# Patient Record
Sex: Female | Born: 1990 | Race: Black or African American | Hispanic: No | Marital: Married | Smoking: Never smoker
Health system: Southern US, Community
[De-identification: ages and names within clinical notes are randomized; demographics above are authoritative.]

## PROBLEM LIST (undated history)

## (undated) DIAGNOSIS — A630 Anogenital (venereal) warts: Secondary | ICD-10-CM

## (undated) DIAGNOSIS — D649 Anemia, unspecified: Secondary | ICD-10-CM

## (undated) DIAGNOSIS — D573 Sickle-cell trait: Secondary | ICD-10-CM

## (undated) HISTORY — DX: Anogenital (venereal) warts: A63.0

## (undated) HISTORY — DX: Anemia, unspecified: D64.9

## (undated) HISTORY — PX: NO PAST SURGERIES: SHX2092

## (undated) HISTORY — PX: WISDOM TOOTH EXTRACTION: SHX21

## (undated) HISTORY — DX: Sickle-cell trait: D57.3

---

## 2015-04-04 ENCOUNTER — Encounter (HOSPITAL_COMMUNITY): Payer: Self-pay | Admitting: *Deleted

## 2015-04-04 ENCOUNTER — Emergency Department (INDEPENDENT_AMBULATORY_CARE_PROVIDER_SITE_OTHER)
Admission: EM | Admit: 2015-04-04 | Discharge: 2015-04-04 | Disposition: A | Discharge status: Home / Self Care | Payer: Self-pay | Source: Home / Self Care | Attending: Emergency Medicine | Admitting: Emergency Medicine

## 2015-04-04 DIAGNOSIS — B373 Candidiasis of vulva and vagina: Secondary | ICD-10-CM

## 2015-04-04 DIAGNOSIS — B3731 Acute candidiasis of vulva and vagina: Secondary | ICD-10-CM

## 2015-04-04 LAB — POCT URINALYSIS DIP (DEVICE)
Bilirubin Urine: NEGATIVE
Glucose, UA: NEGATIVE mg/dL
Hgb urine dipstick: NEGATIVE
KETONES UR: NEGATIVE mg/dL
Leukocytes, UA: NEGATIVE
Nitrite: NEGATIVE
PH: 5.5 (ref 5.0–8.0)
PROTEIN: NEGATIVE mg/dL
Specific Gravity, Urine: >=1.03 (ref 1.005–1.030)
UROBILINOGEN UA: 0.2 mg/dL (ref 0.0–1.0)

## 2015-04-04 LAB — POCT PREGNANCY, URINE: PREG TEST UR: NEGATIVE

## 2015-04-04 MED ORDER — FLUCONAZOLE 150 MG PO TABS: 150.0000 mg | ORAL_TABLET | Freq: Once | ORAL | Status: DC

## 2015-04-04 NOTE — Discharge Instructions (Signed)
You have a yeast infection. We sent swabs off for testing. We will call you if anything else is positive. Take Diflucan one pill once. You should see improvement within 1-3 days. This is not contagious. Follow-up as needed.

## 2015-04-04 NOTE — ED Notes (Signed)
Pt   Reports       White   Discharge       With    Odor          And   Small pimples          pt      Reports   Symptoms    X  1  Week  Worse  Over  Last   Several  Days

## 2015-04-04 NOTE — ED Provider Notes (Signed)
CSN: [id number]     Arrival date & time 04/04/15  1631 History   First MD Initiated Contact with Patient 04/04/15 1738     Chief Complaint  Patient presents with  . Vaginal Discharge   (Consider location/radiation/quality/duration/timing/severity/associated sxs/prior Treatment) HPI She is a 24 year old woman here for evaluation of vaginal discharge. She reports a white discharge over the last several days. She has also noted some bumps around her vagina. She denies any itching. She does report some mild abdominal pain. This has gradually been getting worse. No fever or chills. She had unprotected sex earlier this month. This was with a new partner.  History reviewed. No pertinent past medical history. History reviewed. No pertinent past surgical history. No family history on file. Social History  Substance Use Topics  . Smoking status: Never Smoker   . Smokeless tobacco: None  . Alcohol Use: No   OB History    No data available     Review of Systems As in history of present illness Allergies  Review of patient's allergies indicates no known allergies.  Home Medications   Prior to Admission medications   Medication Sig Start Date End Date Taking? Authorizing Provider  fluconazole (DIFLUCAN) 150 MG tablet Take 1 tablet (150 mg total) by mouth once. 04/04/15   Charm Rings, MD   BP 114/80 mmHg  Pulse 78  Temp(Src) 98.1 F (36.7 C) (Oral)  Resp 16  SpO2 98%  LMP 03/12/2015 (Exact Date) Physical Exam  Constitutional: She is oriented to person, place, and time. She appears well-developed and well-nourished. No distress.  Cardiovascular: Normal rate.   Pulmonary/Chest: Effort normal.  Genitourinary:    Cervix exhibits no motion tenderness and no discharge. No bleeding in the vagina. No foreign body around the vagina. Vaginal discharge (thick adherent white along with some clear thin discharge) found.  Neurological: She is alert and oriented to person, place, and time.     ED Course  Procedures (including critical care time) Labs Review Labs Reviewed  HERPES SIMPLEX VIRUS CULTURE  POCT URINALYSIS DIP (DEVICE)  POCT PREGNANCY, URINE  CERVICOVAGINAL ANCILLARY ONLY    Imaging Review No results found.   MDM   1. Yeast vaginitis    Exam is consistent with yeast vaginitis. Swabs sent for additional evaluation. Treat with Diflucan. Follow-up as needed.    Charm Rings, MD 04/04/15 [phone]

## 2015-04-05 LAB — CERVICOVAGINAL ANCILLARY ONLY
Chlamydia: NEGATIVE
Neisseria Gonorrhea: NEGATIVE

## 2015-04-06 LAB — CERVICOVAGINAL ANCILLARY ONLY: WET PREP (BD AFFIRM): POSITIVE — AB

## 2015-04-06 LAB — HERPES SIMPLEX VIRUS CULTURE: Culture: NOT DETECTED

## 2015-04-10 MED ORDER — METRONIDAZOLE 500 MG PO TABS: 500.0000 mg | ORAL_TABLET | Freq: Two times a day (BID) | ORAL | Status: DC

## 2015-04-10 NOTE — ED Notes (Signed)
Patient called about final report of testing. Positive for gardnerella, yeast. Already treated for yeast, new Rx for BV authorized by Randal Buba, MD. Spoke to patient to inform of results, requests %Rx called in to [facility], at [place], [address]. Called nad spoke directly w pharmacist. Rx Flagyl 500 mg, BID x 7 days, QS, NR

## 2015-07-24 LAB — OB RESULTS CONSOLE RUBELLA ANTIBODY, IGM: RUBELLA: IMMUNE

## 2015-07-24 LAB — OB RESULTS CONSOLE ABO/RH: RH Type: POSITIVE

## 2015-07-24 LAB — OB RESULTS CONSOLE GC/CHLAMYDIA
CHLAMYDIA, DNA PROBE: NEGATIVE
GC PROBE AMP, GENITAL: NEGATIVE

## 2015-07-24 LAB — OB RESULTS CONSOLE ANTIBODY SCREEN: Antibody Screen: NEGATIVE

## 2015-07-24 LAB — OB RESULTS CONSOLE RPR: RPR: NONREACTIVE

## 2015-07-24 LAB — OB RESULTS CONSOLE HIV ANTIBODY (ROUTINE TESTING): HIV: NONREACTIVE

## 2015-07-24 LAB — OB RESULTS CONSOLE HEPATITIS B SURFACE ANTIGEN: Hepatitis B Surface Ag: NEGATIVE

## 2015-08-20 NOTE — L&D Delivery Note (Signed)
Patient is 25 y.o. G1P0 7635w1d admitted for PD-IOL. Progressed normally through labor.    Delivery Note At 8:02 PM a viable female was delivered via Vaginal, Spontaneous Delivery (Presentation: ; Occiput Anterior).  APGAR: 8, 9; weight 6 lb 15.5 oz (3161 g).   Placenta status: Intact, Spontaneous.  Cord: 3 vessels with the following complications: None.  Cord pH: not collected  Anesthesia:   Episiotomy:   Lacerations: 2nd degree;Labial Suture Repair: 3.0 vicryl- Repaired 2nd degree in the typical fashion. Right labial which was close to clitoral hood was repaired with two figure eight sutures. Left labia repaired with two figure of eight sutures.  Est. Blood Loss (mL):  150  Mom to postpartum.  Baby to Couplet care / Skin to Skin.  Isa RankinKimberly Niles [facility] 01/20/2016, 9:37 PM

## 2015-12-21 LAB — OB RESULTS CONSOLE GBS: GBS: NEGATIVE

## 2016-01-16 ENCOUNTER — Telehealth (HOSPITAL_COMMUNITY): Payer: Self-pay | Admitting: *Deleted

## 2016-01-16 ENCOUNTER — Encounter (HOSPITAL_COMMUNITY): Payer: Self-pay | Admitting: *Deleted

## 2016-01-16 NOTE — Telephone Encounter (Signed)
Preadmission screen Interpreter number [id number] 

## 2016-01-17 ENCOUNTER — Ambulatory Visit (INDEPENDENT_AMBULATORY_CARE_PROVIDER_SITE_OTHER): Payer: Medicaid Other | Admitting: *Deleted

## 2016-01-17 VITALS — BP 113/83 | HR 108

## 2016-01-17 DIAGNOSIS — Z36 Encounter for antenatal screening of mother: Secondary | ICD-10-CM | POA: Diagnosis not present

## 2016-01-17 DIAGNOSIS — O480 Post-term pregnancy: Secondary | ICD-10-CM | POA: Diagnosis present

## 2016-01-17 NOTE — Progress Notes (Signed)
IOL scheduled 6/2 @ midnight

## 2016-01-17 NOTE — Progress Notes (Signed)
NST 01/17/16 reactive

## 2016-01-19 ENCOUNTER — Encounter (HOSPITAL_COMMUNITY): Payer: Self-pay

## 2016-01-19 ENCOUNTER — Inpatient Hospital Stay (HOSPITAL_COMMUNITY)
Admission: RE | Admit: 2016-01-19 | Discharge: 2016-01-22 | DRG: 774 | Disposition: A | Discharge status: Home / Self Care | Payer: Medicaid Other | Source: Ambulatory Visit | Attending: Family Medicine | Admitting: Family Medicine

## 2016-01-19 DIAGNOSIS — O480 Post-term pregnancy: Secondary | ICD-10-CM | POA: Diagnosis present

## 2016-01-19 DIAGNOSIS — O9832 Other infections with a predominantly sexual mode of transmission complicating childbirth: Secondary | ICD-10-CM | POA: Diagnosis present

## 2016-01-19 DIAGNOSIS — Z8249 Family history of ischemic heart disease and other diseases of the circulatory system: Secondary | ICD-10-CM

## 2016-01-19 DIAGNOSIS — Z3A41 41 weeks gestation of pregnancy: Secondary | ICD-10-CM | POA: Diagnosis not present

## 2016-01-19 DIAGNOSIS — S01512A Laceration without foreign body of oral cavity, initial encounter: Secondary | ICD-10-CM

## 2016-01-19 DIAGNOSIS — A630 Anogenital (venereal) warts: Secondary | ICD-10-CM | POA: Diagnosis present

## 2016-01-19 LAB — TYPE AND SCREEN
ABO/RH(D): B POS
ANTIBODY SCREEN: NEGATIVE

## 2016-01-19 LAB — CBC
HEMATOCRIT: 33.2 % — AB (ref 36.0–46.0)
HEMOGLOBIN: 11.5 g/dL — AB (ref 12.0–15.0)
MCH: 26.9 pg (ref 26.0–34.0)
MCHC: 34.6 g/dL (ref 30.0–36.0)
MCV: 77.6 fL — ABNORMAL LOW (ref 78.0–100.0)
Platelets: 196 10*3/uL (ref 150–400)
RBC: 4.28 MIL/uL (ref 3.87–5.11)
RDW: 14.2 % (ref 11.5–15.5)
WBC: 7.4 10*3/uL (ref 4.0–10.5)

## 2016-01-19 LAB — RPR: RPR: NONREACTIVE

## 2016-01-19 LAB — ABO/RH: ABO/RH(D): B POS

## 2016-01-19 MED ORDER — OXYCODONE-ACETAMINOPHEN 5-325 MG PO TABS
1.0000 | ORAL_TABLET | ORAL | Status: DC | PRN
Start: 2016-01-19 — End: 2016-01-20

## 2016-01-19 MED ORDER — OXYTOCIN BOLUS FROM INFUSION
500.0000 mL | INTRAVENOUS | Status: DC
  Administered 2016-01-20: 500 mL via INTRAVENOUS

## 2016-01-19 MED ORDER — OXYTOCIN 40 UNITS IN LACTATED RINGERS INFUSION - SIMPLE MED: 2.5000 [IU]/h | INTRAVENOUS | Status: DC

## 2016-01-19 MED ORDER — LACTATED RINGERS IV SOLN: 500.0000 mL | INTRAVENOUS | Status: DC | PRN

## 2016-01-19 MED ORDER — FENTANYL CITRATE (PF) 100 MCG/2ML IJ SOLN
100.0000 ug | INTRAMUSCULAR | Status: DC | PRN
  Administered 2016-01-19 – 2016-01-20 (×2): 100 ug via INTRAVENOUS
  Filled 2016-01-19 (×2): qty 2

## 2016-01-19 MED ORDER — TERBUTALINE SULFATE 1 MG/ML IJ SOLN
0.2500 mg | Freq: Once | INTRAMUSCULAR | Status: DC | PRN
  Filled 2016-01-19: qty 1

## 2016-01-19 MED ORDER — LIDOCAINE HCL (PF) 1 % IJ SOLN
30.0000 mL | INTRAMUSCULAR | Status: DC | PRN
  Administered 2016-01-20: 30 mL via SUBCUTANEOUS
  Filled 2016-01-19: qty 30

## 2016-01-19 MED ORDER — ONDANSETRON HCL 4 MG/2ML IJ SOLN: 4.0000 mg | Freq: Four times a day (QID) | INTRAMUSCULAR | Status: DC | PRN

## 2016-01-19 MED ORDER — FLEET ENEMA 7-19 GM/118ML RE ENEM: 1.0000 | ENEMA | RECTAL | Status: DC | PRN

## 2016-01-19 MED ORDER — MISOPROSTOL 25 MCG QUARTER TABLET
25.0000 ug | ORAL_TABLET | ORAL | Status: DC | PRN
  Administered 2016-01-19 (×3): 25 ug via VAGINAL
  Filled 2016-01-19 (×2): qty 0.25
  Filled 2016-01-19: qty 1
  Filled 2016-01-19: qty 0.25

## 2016-01-19 MED ORDER — LACTATED RINGERS IV SOLN
INTRAVENOUS | Status: DC
  Administered 2016-01-19 (×4): via INTRAVENOUS

## 2016-01-19 MED ORDER — ACETAMINOPHEN 325 MG PO TABS: 650.0000 mg | ORAL_TABLET | ORAL | Status: DC | PRN

## 2016-01-19 MED ORDER — SOD CITRATE-CITRIC ACID 500-334 MG/5ML PO SOLN: 30.0000 mL | ORAL | Status: DC | PRN

## 2016-01-19 MED ORDER — OXYCODONE-ACETAMINOPHEN 5-325 MG PO TABS: 2.0000 | ORAL_TABLET | ORAL | Status: DC | PRN

## 2016-01-19 MED ORDER — ZOLPIDEM TARTRATE 5 MG PO TABS
5.0000 mg | ORAL_TABLET | Freq: Every evening | ORAL | Status: DC | PRN
  Administered 2016-01-19: 5 mg via ORAL
  Filled 2016-01-19 (×2): qty 1

## 2016-01-19 NOTE — Anesthesia Pain Management Evaluation Note (Signed)
  [facility] Pain Management Visit Note  Patient: [name], 25 y.o., female  "Hello I am a [facility] of the anesthesia [facility]. We have an anesthesia team available at all times to provide care throughout the hospital, including epidural management and anesthesia for C-section. I don't know your plan for the delivery whether it a natural birth, water birth, IV sedation, nitrous supplementation, doula or epidural, but we want to meet your pain goals."   1.Was your pain managed to your expectations on prior hospitalizations?   No prior hospitalizations  2.What is your expectation for pain management during this hospitalization?     Epidural  3.How can we help you reach that goal? Epidural when desired.  Record the patient's initial score and the patient's pain goal.   Pain: 4  Pain Goal: Undecided [facility] wants you to be able to say your pain was always managed very well.  Katie Mayer 01/19/2016

## 2016-01-19 NOTE — Progress Notes (Signed)
Patient ID: Katie Mayer, female   DOB: 01/14/1991, 25 y.o.   MRN: [id number]  Pt without complaint; s/p cytotec x 3 doses FHR 150s, +accels, no decels Irreg mild ctx Cx 1/70/-2; FB placed without difficulty  IUP@41 .0wks Cx unfavorable  Will leave foley  In place until it comes out Anticipate SVD  Cam HaiSHAW, [name] CNM 01/19/2016 6:45 PM

## 2016-01-19 NOTE — H&P (Signed)
  Katie Mayer is a 25 y.o. female G1P0 with IUP at 4758w0d presenting for IOL for postdates. [facility]   Prenatal History/Complications:  none   Past Medical History: Past Medical History  Diagnosis Date  . HPV (human papilloma virus) anogenital infection   . Anemia     Past Surgical History: History reviewed. No pertinent past surgical history.  Obstetrical History: OB History    Gravida Para Term Preterm AB TAB SAB Ectopic Multiple Living   1               Social History: Social History   Social History  . Marital Status: Married    Spouse Name: N/A  . Number of Children: N/A  . Years of Education: N/A   Social History Main Topics  . Smoking status: Never Smoker   . Smokeless tobacco: None  . Alcohol Use: No  . Drug Use: None  . Sexual Activity: Not Asked   Other Topics Concern  . None   Social History Narrative    Family History: Family History  Problem Relation Age of Onset  . Hypertension Mother   . Hypertension Father     Allergies: No Known Allergies  Prescriptions prior to admission  Medication Sig Dispense Refill Last Dose  . fluconazole (DIFLUCAN) 150 MG tablet Take 1 tablet (150 mg total) by mouth once. 1 tablet 0   . metroNIDAZOLE (FLAGYL) 500 MG tablet Take 1 tablet (500 mg total) by mouth 2 (two) times daily. 14 tablet 0      Prenatal Transfer Tool  Maternal Diabetes: No Genetic Screening: Normal Maternal Ultrasounds/Referrals: Normal Fetal Ultrasounds or other Referrals:  None Maternal Substance Abuse:  No Significant Maternal Medications:  None Significant Maternal Lab Results: None     Review of Systems   Constitutional: Negative for fever and chills Eyes: Negative for visual disturbances Respiratory: Negative for shortness of breath, dyspnea Cardiovascular: Negative for chest pain or palpitations  Gastrointestinal: Negative for abdominal pain, vomiting, diarrhea and constipation.   Genitourinary: Negative  for dysuria and urgency Musculoskeletal: Negative for back pain, joint pain, myalgias  Neurological: Negative for dizziness and headaches      Height 5\' 8"  (1.727 m), weight 94.802 kg (209 lb), last menstrual period 04/07/2015. General appearance: alert, cooperative and no distress Lungs: clear to auscultation bilaterally Heart: regular rate and rhythm Abdomen: soft, non-tender; bowel sounds normal Extremities: Homans sign is negative, no sign of DVT DTR's 2+ Presentation: cephalic Fetal monitoring  Baseline: 140 bpm, Variability: Good {> 6 bpm), Accelerations: Reactive and Decelerations: Absent Uterine activity  None  Dilation: Closed Effacement (%): 50 Station: -2 Exam by:: Drenda FreezeFran CNM   Prenatal labs: ABO, Rh: B/Positive/-- (12/05 0000) Antibody: Negative (12/05 0000) Rubella: immune RPR: Nonreactive (12/05 0000)  HBsAg: Negative (12/05 0000)  HIV: Non-reactive (12/05 0000)  GBS: Negative (05/04 0000)  1 hr Glucola 105 Genetic screening  Neg AFP Anatomy US normal   No results found for this or any previous visit (from the past 24 hour(s)).  Assessment: Katie Mayer is a 25 y.o. G1P0 with an IUP at 5258w0d presenting for IOL for postdates.  Plan: #Labor: Cytotec->Foley->pitocin #Pain:  Per request #FWB Cat 1 #ID: GBS: neg   CRESENZO-DISHMAN,Antron Seth 01/19/2016, 1:38 AM

## 2016-01-20 ENCOUNTER — Inpatient Hospital Stay (HOSPITAL_COMMUNITY): Payer: Medicaid Other | Admitting: Anesthesiology

## 2016-01-20 DIAGNOSIS — O480 Post-term pregnancy: Secondary | ICD-10-CM

## 2016-01-20 DIAGNOSIS — Z3A41 41 weeks gestation of pregnancy: Secondary | ICD-10-CM

## 2016-01-20 DIAGNOSIS — S01512A Laceration without foreign body of oral cavity, initial encounter: Secondary | ICD-10-CM

## 2016-01-20 MED ORDER — DIPHENHYDRAMINE HCL 50 MG/ML IJ SOLN: 12.5000 mg | INTRAMUSCULAR | Status: DC | PRN

## 2016-01-20 MED ORDER — OXYTOCIN 40 UNITS IN LACTATED RINGERS INFUSION - SIMPLE MED
1.0000 m[IU]/min | INTRAVENOUS | Status: DC
  Administered 2016-01-20: 2 m[IU]/min via INTRAVENOUS
  Filled 2016-01-20: qty 1000

## 2016-01-20 MED ORDER — FENTANYL 2.5 MCG/ML BUPIVACAINE 1/10 % EPIDURAL INFUSION (WH - ANES)
14.0000 mL/h | INTRAMUSCULAR | Status: DC | PRN
  Administered 2016-01-20: 14 mL/h via EPIDURAL
  Filled 2016-01-20: qty 125

## 2016-01-20 MED ORDER — OXYCODONE-ACETAMINOPHEN 5-325 MG PO TABS: 2.0000 | ORAL_TABLET | ORAL | Status: DC | PRN

## 2016-01-20 MED ORDER — OXYCODONE-ACETAMINOPHEN 5-325 MG PO TABS
1.0000 | ORAL_TABLET | ORAL | Status: DC | PRN
  Administered 2016-01-21 (×2): 1 via ORAL
  Filled 2016-01-20 (×2): qty 1

## 2016-01-20 MED ORDER — TERBUTALINE SULFATE 1 MG/ML IJ SOLN
0.2500 mg | Freq: Once | INTRAMUSCULAR | Status: DC | PRN
  Filled 2016-01-20: qty 1

## 2016-01-20 MED ORDER — BENZOCAINE-MENTHOL 20-0.5 % EX AERO
1.0000 "application " | INHALATION_SPRAY | CUTANEOUS | Status: DC | PRN
  Administered 2016-01-21 – 2016-01-22 (×2): 1 via TOPICAL
  Filled 2016-01-20 (×2): qty 56

## 2016-01-20 MED ORDER — ONDANSETRON HCL 4 MG/2ML IJ SOLN: 4.0000 mg | INTRAMUSCULAR | Status: DC | PRN

## 2016-01-20 MED ORDER — DIBUCAINE 1 % RE OINT
1.0000 "application " | TOPICAL_OINTMENT | RECTAL | Status: DC | PRN
  Administered 2016-01-21: 1 via RECTAL
  Filled 2016-01-20: qty 28

## 2016-01-20 MED ORDER — EPHEDRINE 5 MG/ML INJ
10.0000 mg | INTRAVENOUS | Status: DC | PRN
  Filled 2016-01-20: qty 2

## 2016-01-20 MED ORDER — DIPHENHYDRAMINE HCL 25 MG PO CAPS: 25.0000 mg | ORAL_CAPSULE | Freq: Four times a day (QID) | ORAL | Status: DC | PRN

## 2016-01-20 MED ORDER — LIDOCAINE HCL (PF) 1 % IJ SOLN
INTRAMUSCULAR | Status: DC | PRN
  Administered 2016-01-20 (×2): 5 mL

## 2016-01-20 MED ORDER — ONDANSETRON HCL 4 MG PO TABS: 4.0000 mg | ORAL_TABLET | ORAL | Status: DC | PRN

## 2016-01-20 MED ORDER — SIMETHICONE 80 MG PO CHEW: 80.0000 mg | CHEWABLE_TABLET | ORAL | Status: DC | PRN

## 2016-01-20 MED ORDER — LACTATED RINGERS IV SOLN
500.0000 mL | Freq: Once | INTRAVENOUS | Status: AC
  Administered 2016-01-20: 500 mL via INTRAVENOUS

## 2016-01-20 MED ORDER — PHENYLEPHRINE 40 MCG/ML (10ML) SYRINGE FOR IV PUSH (FOR BLOOD PRESSURE SUPPORT)
80.0000 ug | PREFILLED_SYRINGE | INTRAVENOUS | Status: DC | PRN
  Filled 2016-01-20: qty 10
  Filled 2016-01-20: qty 5

## 2016-01-20 MED ORDER — PHENYLEPHRINE 40 MCG/ML (10ML) SYRINGE FOR IV PUSH (FOR BLOOD PRESSURE SUPPORT)
80.0000 ug | PREFILLED_SYRINGE | INTRAVENOUS | Status: DC | PRN
  Filled 2016-01-20: qty 5

## 2016-01-20 MED ORDER — COCONUT OIL OIL: 1.0000 "application " | TOPICAL_OIL | Status: DC | PRN

## 2016-01-20 MED ORDER — DOCUSATE SODIUM 100 MG PO CAPS
100.0000 mg | ORAL_CAPSULE | Freq: Two times a day (BID) | ORAL | Status: DC
  Administered 2016-01-21 – 2016-01-22 (×4): 100 mg via ORAL
  Filled 2016-01-20 (×4): qty 1

## 2016-01-20 MED ORDER — ACETAMINOPHEN 325 MG PO TABS: 650.0000 mg | ORAL_TABLET | ORAL | Status: DC | PRN

## 2016-01-20 MED ORDER — PRENATAL MULTIVITAMIN CH
1.0000 | ORAL_TABLET | Freq: Every day | ORAL | Status: DC
  Administered 2016-01-21 – 2016-01-22 (×2): 1 via ORAL
  Filled 2016-01-20 (×2): qty 1

## 2016-01-20 MED ORDER — IBUPROFEN 600 MG PO TABS
600.0000 mg | ORAL_TABLET | Freq: Four times a day (QID) | ORAL | Status: DC
  Administered 2016-01-21 – 2016-01-22 (×7): 600 mg via ORAL
  Filled 2016-01-20 (×7): qty 1

## 2016-01-20 MED ORDER — WITCH HAZEL-GLYCERIN EX PADS
1.0000 "application " | MEDICATED_PAD | CUTANEOUS | Status: DC | PRN
  Administered 2016-01-21: 1 via TOPICAL

## 2016-01-20 NOTE — Progress Notes (Signed)
Labor Progress Note Katie DoomYasmina Mayer is a 25 y.o. G1P0 at 6162w1d presented for PD- IOL  S: s/p FB  O:  BP 116/74 mmHg  Pulse 80  Temp(Src) 98.3 F (36.8 C) (Oral)  Resp 18  Ht 5\' 8"  (1.727 m)  Wt 209 lb (94.802 kg)  BMI 31.79 kg/m2  LMP 04/07/2015 EFM: 145/mod/+accels, variable decels- occasional  CVE: Dilation: 3.5 Effacement (%): 70 Cervical Position: Posterior Station: -2 Presentation: Vertex Exam by:: Katrinka BlazingSmith RN   A&P: 25 y.o. G1P0 7162w1d PD-IOL #Labor: Progressing well. S/p Cytotec/FB. Starting pitocin.  #Pain: prn meds, epidural at maternal request #FWB:  Cat II, good beat to beat  #GBS negative  Federico FlakeKimberly Niles Theo Krumholz, MD 10:21 AM

## 2016-01-20 NOTE — Anesthesia Preprocedure Evaluation (Signed)

## 2016-01-20 NOTE — Anesthesia Procedure Notes (Signed)
Epidural Patient location during procedure: OB  Staffing Anesthesiologist: Zo Loudon Performed by: anesthesiologist   Preanesthetic Checklist Completed: patient identified, site marked, surgical consent, pre-op evaluation, timeout performed, IV checked, risks and benefits discussed and monitors and equipment checked  Epidural Patient position: sitting Prep: DuraPrep Patient monitoring: heart rate, continuous pulse ox and blood pressure Approach: right paramedian Location: L3-L4 Injection technique: LOR saline  Needle:  Needle type: Tuohy  Needle gauge: 17 G Needle length: 9 cm and 9 Needle insertion depth: 6 cm Catheter type: closed end flexible Catheter size: 20 Guage Catheter at skin depth: 11 cm Test dose: negative  Assessment Events: blood not aspirated, injection not painful, no injection resistance, negative IV test and no paresthesia  Additional Notes Patient identified. Risks/Benefits/Options discussed with patient including but not limited to bleeding, infection, nerve damage, paralysis, failed block, incomplete pain control, headache, blood pressure changes, nausea, vomiting, reactions to medication both or allergic, itching and postpartum back pain. Confirmed with bedside nurse the patient's most recent platelet count. Confirmed with patient that they are not currently taking any anticoagulation, have any bleeding history or any family history of bleeding disorders. Patient expressed understanding and wished to proceed. All questions were answered. Sterile technique was used throughout the entire procedure. Please see nursing notes for vital signs. Test dose was given through epidural needle and negative prior to continuing to dose epidural or start infusion. Warning signs of high block given to the patient including shortness of breath, tingling/numbness in hands, complete motor block, or any concerning symptoms with instructions to call for help. Patient was given  instructions on fall risk and not to get out of bed. All questions and concerns addressed with instructions to call with any issues.   

## 2016-01-20 NOTE — Progress Notes (Signed)
Patient ID: Katie Mayer, female   DOB: 17-Jun-1991, 25 y.o.   MRN: [id number]  Foley bulb out approx 2 hrs ago. Pt sleeping.  FHR 130s, +accels, no decels Ctx irreg Exam deferred  IUP@term  Post dates  Will allow to eat a light breakfast and then will start Pit Anticipate SVD  Clelia CroftSHAW, [facility]LY 01/20/2016 7:29 AM

## 2016-01-21 NOTE — Progress Notes (Signed)
Post Partum Day 1 Subjective:  Katie Mayer is a 25 y.o. G1P0 10960w2d s/p spontaneous vaginal delivery.  No acute events overnight.  Pt denies problems with ambulating or voiding.  She denies nausea or vomiting.  Pain is well controlled.  She has had flatus.  Lochia Minimal.  Plan for birth control is condoms.  Method of Feeding: breast.  Objective: Blood pressure 119/72, pulse 85, temperature 97.5 F (36.4 C), temperature source Oral, resp. rate 16, height 5\' 8"  (1.727 m), weight 94.802 kg (209 lb), last menstrual period 04/07/2015, SpO2 96 %.  Physical Exam:  General: alert, cooperative and no distress Chest: normal WOB Heart: Regular rate Abdomen: +BS, soft, mild TTP (appropriate) DVT Evaluation: no evidence of DVT seen on physical exam. Extremities: no edema   Recent Labs  01/19/16 0200  HGB 11.5*  HCT 33.2*    Assessment/Plan:  ASSESSMENT: Katie Mayer is a 25 y.o. G1P0 3060w2d s/p spontaneous vaginal delivery.  #Postpartum Care Plan for discharge tomorrow Continue routine PP care Breastfeeding support PRN  OB fellow attestation:  I have seen and examined this patient; I agree with above documentation in the resident's note.   Federico FlakeKimberly Niles Hakeem Frazzini, MD 9:40 AM

## 2016-01-21 NOTE — Anesthesia Postprocedure Evaluation (Signed)
Anesthesia Post Note  Patient: Juanito DoomYasmina Schendel  Procedure(s) Performed: * No procedures listed *  Patient location during evaluation: Mother Baby Anesthesia Type: Epidural Level of consciousness: awake and alert Pain management: pain level controlled Vital Signs Assessment: post-procedure vital signs reviewed and stable Respiratory status: spontaneous breathing, nonlabored ventilation and respiratory function stable Cardiovascular status: stable Postop Assessment: no headache, no backache and epidural receding Anesthetic complications: no     Last Vitals:  Filed Vitals:   01/20/16 2230 01/21/16 0000  BP: 115/67 119/72  Pulse: 99 85  Temp: 36.9 C 36.4 C  Resp: 18 16    Last Pain:  Filed Vitals:   01/21/16 0649  PainSc: 0-No pain   Pain Goal:                 Junious SilkGILBERT,Rosalena Mccorry

## 2016-01-21 NOTE — Lactation Note (Signed)
This note was copied from a baby's chart. Lactation Consultation Note  Assisted mother with latching baby in a laid back position.  He was difficult to latch and once he was attached he had to be stimulated to suckle. Long jaw excursions were not present and the suck swallow ratio was 6-12:1 which indicates difficulty with transfer. Mom was taught hand expression and she is easily able to express colostrum.  A small amount was spoon fed to the baby. He had difficulty extending his tongue past the gum line when his mouth was opened so the spoon was placed on his gum and he was able to reach the milk with his tongue. A frenum is visible under his tongue. He had difficulty sucking on a gloved finger. Concern that he may continue to not transfer well.  This information was communicated to Dr Manson PasseyBrown and Barnetta ChapelLauren Rafeek NP.  Will set up a double electric breast pump to aid in protecting supply and encourage mother to spoon fed any expressed milk back to the baby. Patient Name: Katie Juanito DoomYasmina Guard [id number]'s Date: 01/21/2016 Reason for consult: Initial assessment   Maternal Data Has patient been taught Hand Expression?: Yes  Feeding Feeding Type: Breast Fed  LATCH Score/Interventions Latch: Repeated attempts needed to sustain latch, nipple held in mouth throughout feeding, stimulation needed to elicit sucking reflex.  Audible Swallowing: A few with stimulation  Type of Nipple: Everted at rest and after stimulation  Comfort (Breast/Nipple): Soft / non-tender     Hold (Positioning): Assistance needed to correctly position infant at breast and maintain latch.  LATCH Score: 7  Lactation Tools Discussed/Used     Consult Status Consult Status: Follow-up Date: 01/22/16 Follow-up type: In-patient    Katie Mayer, Katie Mayer 01/21/2016, 10:36 AM

## 2016-01-22 MED ORDER — SIMETHICONE 80 MG PO CHEW: 80.0000 mg | CHEWABLE_TABLET | Freq: Once | ORAL | Status: DC

## 2016-01-22 MED ORDER — IBUPROFEN 600 MG PO TABS: 600.0000 mg | ORAL_TABLET | Freq: Four times a day (QID) | ORAL | Status: DC

## 2016-01-22 MED ORDER — DOCUSATE SODIUM 100 MG PO CAPS: 100.0000 mg | ORAL_CAPSULE | Freq: Two times a day (BID) | ORAL | Status: DC

## 2016-01-22 MED ORDER — OXYCODONE-ACETAMINOPHEN 5-325 MG PO TABS: 1.0000 | ORAL_TABLET | Freq: Three times a day (TID) | ORAL | Status: DC | PRN

## 2016-01-22 NOTE — Lactation Note (Addendum)
This note was copied from a baby's chart. Lactation Consultation Note  Assisted mother with deeper latch.  Suck swallow ratio continues to be 6-7:1.  Recommended mother post-pump for 10 minutes after BF, alternating breasts at each feeding as she will only have a hand pump at home. Pumping will help to support milk supply related to current high suck:swallow ratio.  Aware of support groups and outpatient services.  Patient Name: Katie Mayer DoomYasmina Cambre ZOXWR'UToday's Date: 01/22/2016 Reason for consult: Follow-up assessment   Maternal Data    Feeding Feeding Type: Breast Fed Length of feed: 10 min  LATCH Score/Interventions Latch: Repeated attempts needed to sustain latch, nipple held in mouth throughout feeding, stimulation needed to elicit sucking reflex. (end of feeding) Intervention(s): Adjust position;Assist with latch (encouraged mother to wait for infant to open mouth wide)  Audible Swallowing: A few with stimulation Intervention(s): Skin to skin;Hand expression  Type of Nipple: Everted at rest and after stimulation Intervention(s): Hand pump;Double electric pump  Comfort (Breast/Nipple): Soft / non-tender  Problem noted: Mild/Moderate discomfort Interventions (Mild/moderate discomfort): Hand expression;Post-pump (colostrum to nippple)  Hold (Positioning): Assistance needed to correctly position infant at breast and maintain latch.  LATCH Score: 7  Lactation Tools Discussed/Used     Consult Status      Soyla DryerJoseph, Marabelle Cushman 01/22/2016, 10:32 AM

## 2016-01-22 NOTE — Discharge Instructions (Signed)
Vaginal Delivery During delivery, your health care provider will help you give birth to your baby. During a vaginal delivery, you will work to push the baby out of your vagina. However, before you can push your baby out, a few things need to happen. The opening of your uterus (cervix) has to soften, thin out, and open up (dilate) all the way to 10 cm. Also, your baby has to move down from the uterus into your vagina.  SIGNS OF LABOR  Your health care provider will first need to make sure you are in labor. Signs of labor include:  1. Passing what is called the mucous plug before labor begins. This is a small amount of blood-stained mucus. 2. Having regular, painful uterine contractions.  3. The time between contractions gets shorter.  4. The discomfort and pain gradually get more intense. 5. Contraction pains get worse when walking and do not go away when resting.  6. Your cervix becomes thinner (effacement) and dilates. BEFORE THE DELIVERY Once you are in labor and admitted into the hospital or care center, your health care provider may do the following:   Perform a complete physical exam.  Review any complications related to pregnancy or labor.  Check your blood pressure, pulse, temperature, and heart rate (vital signs).   Determine if, and when, the rupture of amniotic membranes occurred.  Do a vaginal exam (using a sterile glove and lubricant) to determine:   The position (presentation) of the baby. Is the baby's head presenting first (vertex) in the birth canal (vagina), or are the feet or buttocks first (breech)?   The level (station) of the baby's head within the birth canal.   The effacement and dilatation of the cervix.   An electronic fetal monitor is usually placed on your abdomen when you first arrive. This is used to monitor your contractions and the baby's heart rate.  When the monitor is on your abdomen (external fetal monitor), it can only pick up the frequency  and length of your contractions. It cannot tell the strength of your contractions.  If it becomes necessary for your health care provider to know exactly how strong your contractions are or to see exactly what the baby's heart rate is doing, an internal monitor may be inserted into your vagina and uterus. Your health care provider will discuss the benefits and risks of using an internal monitor and obtain your permission before inserting the device.  Continuous fetal monitoring may be needed if you have an epidural, are receiving certain medicines (such as oxytocin), or have pregnancy or labor complications.  An IV access tube may be placed into a vein in your arm to deliver fluids and medicines if necessary. THREE STAGES OF LABOR AND DELIVERY Normal labor and delivery is divided into three stages. First Stage This stage starts when you begin to contract regularly and your cervix begins to efface and dilate. It ends when your cervix is completely open (fully dilated). The first stage is the longest stage of labor and can last from 3 hours to 15 hours.  Several methods are available to help with labor pain. You and your health care provider will decide which option is best for you. Options include:   Opioid medicines. These are strong pain medicines that you can get through your IV tube or as a shot into your muscle. These medicines lessen pain but do not make it go away completely.  Epidural. A medicine is given through a thin tube that  is inserted in your back. The medicine numbs the lower part of your body and prevents any pain in that area.  Paracervical pain medicine. This is an injection of an anesthetic on each side of your cervix.   You may request natural childbirth, which does not involve the use of pain medicines or an epidural during labor and delivery. Instead, you will use other things, such as breathing exercises, to help cope with the pain. Second Stage The second stage of labor  begins when your cervix is fully dilated at 10 cm. It continues until you push your baby down through the birth canal and the baby is born. This stage can take only minutes or several hours.  The location of your baby's head as it moves through the birth canal is reported as a number called a station. If the baby's head has not started its descent, the station is described as being at minus 3 (-3). When your baby's head is at the zero station, it is at the middle of the birth canal and is engaged in the pelvis. The station of your baby helps indicate the progress of the second stage of labor.  When your baby is born, your health care provider may hold the baby with his or her head lowered to prevent amniotic fluid, mucus, and blood from getting into the baby's lungs. The baby's mouth and nose may be suctioned with a small bulb syringe to remove any additional fluid.  Your health care provider may then place the baby on your stomach. It is important to keep the baby from getting cold. To do this, the health care provider will dry the baby off, place the baby directly on your skin (with no blankets between you and the baby), and cover the baby with warm, dry blankets.   The umbilical cord is cut. Third Stage During the third stage of labor, your health care provider will deliver the placenta (afterbirth) and make sure your bleeding is under control. The delivery of the placenta usually takes about 5 minutes but can take up to 30 minutes. After the placenta is delivered, a medicine may be given either by IV or injection to help contract the uterus and control bleeding. If you are planning to breastfeed, you can try to do so now. After you deliver the placenta, your uterus should contract and get very firm. If your uterus does not remain firm, your health care provider will massage it. This is important because the contraction of the uterus helps cut off bleeding at the site where the placenta was attached  to your uterus. If your uterus does not contract properly and stay firm, you may continue to bleed heavily. If there is a lot of bleeding, medicines may be given to contract the uterus and stop the bleeding.    This information is not intended to replace advice given to you by your health care provider. Make sure you discuss any questions you have with your health care provider.   Document Released: 05/14/2008 Document Revised: 08/26/2014 Document Reviewed: 04/01/2012 Elsevier Interactive Patient Education 2016 [employer] Vaginal Laceration A vaginal laceration is a tear in the vaginal wall. A vaginal tear falls into one of three categories:  7. Obstetrically related tears that occur at the time of childbirth. 8. Trauma-related tears (most often related to sexual intercourse). 9. Spontaneous tears. Vaginal tears can cause heavy bleeding (hemorrhaging) depending on severity of the tear. Tears can be intensely tender and interfere with normal activities  of living. They can make sexual intercourse painful and bring on significant burning with urination. If you have a vaginal laceration, the area around your vagina may be painful when you touch or wipe it. Even light pressure from clothing may cause some pain. Vaginal tear need to be evaluated by your caregiver.  CAUSES   Obstetric-related causes, such as childbirth.  Trauma that may result from an accident during an activity, such as sexual intercourse or a bicycle ride.  Spontaneous causes related to aging, failed healing of a past obstetric tear, chronic irritation, or skin changes that are not well understood. SYMPTOMS   Slight to heavy vaginal bleeding.  Vaginal swelling.  Mild to severe pain.  Vaginal tenderness. DIAGNOSIS  If the tear happened during childbirth, your caregiver can diagnose the tear at that time. To diagnose a vaginal tear that happened spontaneously or because of trauma, your caregiver will perform a physical  exam. During the physical exam, your caregiver may also look for any signs of trouble that may need further testing. If there is hemorrhaging, your caregiver may suggest blood tests to determine the extent of bleeding. Imaging tests may be performed, such as an ultrasonography or computed tomography (CT), to look for internal damage. A biopsy may be need if there are signs of a more serious problem.  TREATMENT  Treatment depends on the severity of the tear. For minor tears that heal on their own, treatment may only consist of keeping the area clean and dry. Some tears need to be repaired with stitches. Other tears may heal on their own with help from various remedies, such as antibiotic ointments, medicated creams, or petroleum products. Depending on the circumstances, oral hormones may also be suggested. Hormone remedies may also be in the form of topical creams and vaginal tablets. For more concerning situations, hospitalization and surgical repair of the tear may be needed. HOME CARE INSTRUCTIONS   Take warm-water baths that cover your hips and buttocks (sitz bath) 2 to 3 times a day. This may help any discomfort and swelling.   Only take over-the-counter or prescription medicines for pain, discomfort, or fever as directed by your caregiver. Do not use aspirin because it can cause increased bleeding.   Do not douche, use tampons, or have intercourse until your caregiver says it is okay.   A bandage (dressing) may have been applied. Change the dressing once a day or as directed. If the dressing sticks, soak it off with warm, soapy water.   Apply ice or witch hazel pads to the vagina to lessen any pain or discomfort.   Take a stool softener or follow a special diet as directed by your caregiver. This will help ease discomfort associated with bowel movements.  SEEK IMMEDIATE MEDICAL CARE IF:   You have redness or swelling in the vaginal area.   You have increasing, sharp, or intense pain  or tenderness in the vaginal area.  You have pus or unusual discharge coming from the tear or vagina.   You notice a bad smell coming from the vagina.   Your tear breaks open after it healed or was repaired.   You feel lightheaded.  You have increasing abdominal pain.   You have an increasing or heavy amount of vaginal bleeding.   You have pain with intercourse after the tear heals.  MAKE SURE YOU:  Understand these instructions.  Will watch your condition.  Will get help right away if you are not doing well or get worse.  This information is not intended to replace advice given to you by your health care provider. Make sure you discuss any questions you have with your health care provider.   Document Released: 08/05/2005 Document Revised: 04/29/2012 Document Reviewed: 12/23/2011 Elsevier Interactive Patient Education [employer]

## 2016-01-22 NOTE — Discharge Summary (Signed)
OB Discharge Summary     Patient Name: Katie Mayer DOB: Apr 04, 1991 MRN: [id number]  Date of admission: 01/19/2016 Delivering MD: Lyndel Safe NILES   Date of discharge: 01/22/2016  Admitting diagnosis: INDUCTION Intrauterine pregnancy: [redacted]w[redacted]d     Secondary diagnosis:  Principal Problem:   NSVD (normal spontaneous vaginal delivery) Active Problems:   Obstetrical laceration, second degree   Laceration of labial mucosa without complication  Additional problems: 2nd degree labial laceration     Discharge diagnosis: Term Pregnancy Delivered                                                                                                Post partum procedures:None  Augmentation: Pitocin, Cytotec and Foley Balloon  Complications: None  Hospital course:  Induction of Labor With Vaginal Delivery   25 y.o. yo G1P0 at [redacted]w[redacted]d was admitted to the hospital 01/19/2016 for induction of labor.  Indication for induction: Postdates.  Patient had an uncomplicated labor course as follows: Membrane Rupture Time/Date: 12:20 PM ,01/20/2016   Intrapartum Procedures: Episiotomy: None [1]                                         Lacerations:  2nd degree [3];Labial [10]  Patient had delivery of a Viable infant.  Information for the patient's newborn:  Ashara, [place] [[id number]]  Delivery Method: Vag-Spont   01/20/2016  Details of delivery can be found in separate delivery note.  Patient had a routine postpartum course. Patient is discharged home 01/22/2016.   Physical exam  Filed Vitals:   01/20/16 2230 01/21/16 0000 01/21/16 1825 01/22/16 0535  BP: 115/67 119/72 100/56 96/51  Pulse: 99 85 98 92  Temp: 98.4 F (36.9 C) 97.5 F (36.4 C) 98.4 F (36.9 C) 98.3 F (36.8 C)  TempSrc: Oral Oral Oral Oral  Resp: Height:      Weight:      SpO2: 100% 96%     General: alert, cooperative and no distress Lochia: appropriate Uterine Fundus: firm Incision: N/A DVT  Evaluation: No evidence of DVT seen on physical exam. Labs: Lab Results  Component Value Date   WBC 7.4 01/19/2016   HGB 11.5* 01/19/2016   HCT 33.2* 01/19/2016   MCV 77.6* 01/19/2016   PLT 196 01/19/2016   No flowsheet data found.  Discharge instruction: per After Visit Summary and "Baby and Me Booklet".  After visit meds:    Medication List    ASK your doctor about these medications        multivitamin-prenatal 27-0.8 MG Tabs tablet  Take 1 tablet by mouth daily at 12 noon.     omeprazole 20 MG capsule  Commonly known as:  PRILOSEC  Take 20 mg by mouth daily.        Diet: routine diet  Activity: Advance as tolerated. Pelvic rest for 6 weeks.   Outpatient follow up:6 weeks Follow up Appt:No future appointments. Follow up Visit:No Follow-up on file.  Postpartum contraception:  Condoms  Newborn Data: Live born female  Birth Weight: 6 lb 15.5 oz (3161 g) APGAR: 8, 9  Baby Feeding: Bottle Disposition:home with mother   01/22/2016 Olena LeatherwoodKelly M Aguilar, MD

## 2016-01-22 NOTE — Progress Notes (Signed)
UR chart review completed.  

## 2016-02-18 ENCOUNTER — Encounter (HOSPITAL_COMMUNITY): Payer: Self-pay

## 2016-02-28 ENCOUNTER — Other Ambulatory Visit: Payer: Self-pay

## 2016-02-28 DIAGNOSIS — Z1231 Encounter for screening mammogram for malignant neoplasm of breast: Secondary | ICD-10-CM

## 2016-08-19 NOTE — L&D Delivery Note (Signed)
Delivery Note At 11:43 PM a viable female was delivered via Vaginal, Spontaneous Delivery (Presentation:LOA).  APGAR:9/9 Delivered without complications.  3VC, delayed cord clamping performed.  Placenta status: intact, discarded    Anesthesia:  epidural Episiotomy: None Lacerations: None Est. Blood Loss (mL): 100  Fundus firm.   Mom to postpartum.  Baby to Couplet care / Skin to Skin.  Kathlene November, SNM 05/16/2017, 11:58 PM   Patient is a G2P1001 at [redacted]w[redacted]d who was admitted with SROM, essentially uncomplicated prenatal course.  She progressed with augmentation via single dose of oral cytotec.  I was gloved and present for delivery in its entirety.  Second stage of labor progressed, baby delivered after a few contractions.  No decels during second stage noted.  Complications: none  Lacerations: none  EBL: 100cc  Cam Hai, CNM 12:07 AM 05/17/2017

## 2016-11-20 ENCOUNTER — Telehealth: Payer: Self-pay | Admitting: *Deleted

## 2016-11-20 NOTE — Telephone Encounter (Signed)
Pt left message stating that she is pregnant and having some bleeding and not sure if she should be worried. She had bleeding yesterday which stopped and then started again today which has not stopped. I returned pt's call and discussed her concern. Today the bleeding is pink in color and light in amount. She has had 1 small clot. Yesterday the bleeding was less in amount. She denies pelvic pain. I advised pt that she should come to the hospital for evaluation if the bleeding becomes heavy and bright red or if she has pelvic/abdominal pain. For now there is nothing to do but observe any changes in the bleeding. She should discuss this bleeding with the provider at her prenatal visit on 4/19.  Pt voiced understanding.

## 2016-11-28 ENCOUNTER — Encounter: Payer: Medicaid Other | Admitting: Advanced Practice Midwife

## 2016-12-05 ENCOUNTER — Ambulatory Visit: Payer: Self-pay

## 2016-12-05 ENCOUNTER — Ambulatory Visit (INDEPENDENT_AMBULATORY_CARE_PROVIDER_SITE_OTHER): Payer: BLUE CROSS/BLUE SHIELD | Admitting: Obstetrics & Gynecology

## 2016-12-05 ENCOUNTER — Encounter: Payer: Self-pay | Admitting: Obstetrics & Gynecology

## 2016-12-05 VITALS — BP 108/69 | HR 89

## 2016-12-05 DIAGNOSIS — Z3480 Encounter for supervision of other normal pregnancy, unspecified trimester: Secondary | ICD-10-CM

## 2016-12-05 DIAGNOSIS — O3680X1 Pregnancy with inconclusive fetal viability, fetus 1: Secondary | ICD-10-CM

## 2016-12-05 DIAGNOSIS — Z3482 Encounter for supervision of other normal pregnancy, second trimester: Secondary | ICD-10-CM

## 2016-12-05 DIAGNOSIS — Z113 Encounter for screening for infections with a predominantly sexual mode of transmission: Secondary | ICD-10-CM | POA: Diagnosis not present

## 2016-12-05 DIAGNOSIS — D573 Sickle-cell trait: Secondary | ICD-10-CM | POA: Insufficient documentation

## 2016-12-05 HISTORY — DX: Sickle-cell trait: D57.3

## 2016-12-05 LAB — POCT URINALYSIS DIP (DEVICE)
BILIRUBIN URINE: NEGATIVE
GLUCOSE, UA: NEGATIVE mg/dL
HGB URINE DIPSTICK: NEGATIVE
Ketones, ur: NEGATIVE mg/dL
NITRITE: NEGATIVE
Protein, ur: NEGATIVE mg/dL
Specific Gravity, Urine: 1.025 (ref 1.005–1.030)
Urobilinogen, UA: 0.2 mg/dL (ref 0.0–1.0)
pH: 6.5 (ref 5.0–8.0)

## 2016-12-05 NOTE — Progress Notes (Signed)
Anatomy US scheduled @ [facility] for April 26th @ 1100.  Pt notified.

## 2016-12-05 NOTE — Progress Notes (Signed)
Pt informed that the ultrasound is considered a limited OB ultrasound and is not intended to be a complete ultrasound exam.  Patient also informed that the ultrasound is not being completed with the intent of assessing for fetal or placental anomalies or any pelvic abnormalities.  Explained that the purpose of today's ultrasound is to assess for viability and estimated EGA.  Patient acknowledges the purpose of the exam and the limitations of the study.    Single IUP FL - 2.10 cm (16w 1d) FHR - 160 bpm per PW doppler FM present Dr. Macon Large informed

## 2016-12-05 NOTE — Patient Instructions (Addendum)
Return to clinic for any scheduled appointments or obstetric concerns, or go to [facility] for evaluation  Thank you for enrolling in MyChart. Please follow the instructions below to securely access your online medical record. MyChart allows you to send messages to your doctor, view your test results, manage appointments, and more.   How Do I Sign Up? 1. In your Internet browser, go to [employer] and enter [email or web address]. 2. Click on the Sign Up Now link in the Sign In box. You will see the New Member Sign Up page. 3. Enter your MyChart Access Code exactly as it appears below. You will not need to use this code after you've completed the sign-up process. If you do not sign up before the expiration date, you must request a new code.  MyChart Access Code: Activation code not generated Current MyChart Status: Patient Declined  4. Enter your Social Security Number ([email or web address]) and Date of Birth (mm/dd/yyyy) as indicated and click Submit. You will be taken to the next sign-up page. 5. Create a MyChart ID. This will be your MyChart login ID and cannot be changed, so think of one that is secure and easy to remember. 6. Create a MyChart password. You can change your password at any time. 7. Enter your Password Reset Question and Answer. This can be used at a later time if you forget your password.  8. Enter your e-mail address. You will receive e-mail notification when new information is available in MyChart. 9. Click Sign Up. You can now view your medical record.   Additional Information Remember, MyChart is NOT to be used for urgent needs. For medical emergencies, dial 911.      Second Trimester of Pregnancy The second trimester is from week 14 through week 27 (months 4 through 6). The second trimester is often a time when you feel your best. Your body has adjusted to being pregnant, and you begin to feel better physically. Usually, morning sickness has lessened or quit  completely, you may have more energy, and you may have an increase in appetite. The second trimester is also a time when the fetus is growing rapidly. At the end of the sixth month, the fetus is about 9 inches long and weighs about 1 pounds. You will likely begin to feel the baby move (quickening) between 16 and 20 weeks of pregnancy. Body changes during your second trimester Your body continues to go through many changes during your second trimester. The changes vary from woman to woman.  Your weight will continue to increase. You will notice your lower abdomen bulging out.  You may begin to get stretch marks on your hips, abdomen, and breasts.  You may develop headaches that can be relieved by medicines. The medicines should be approved by your health care provider.  You may urinate more often because the fetus is pressing on your bladder.  You may develop or continue to have heartburn as a result of your pregnancy.  You may develop constipation because certain hormones are causing the muscles that push waste through your intestines to slow down.  You may develop hemorrhoids or swollen, bulging veins (varicose veins).  You may have back pain. This is caused by:  Weight gain.  Pregnancy hormones that are relaxing the joints in your pelvis.  A shift in weight and the muscles that support your balance.  Your breasts will continue to grow and they will continue to become tender.  Your gums may bleed and may be sensitive to  brushing and flossing.  Dark spots or blotches (chloasma, mask of pregnancy) may develop on your face. This will likely fade after the baby is born.  A dark line from your belly button to the pubic area (linea nigra) may appear. This will likely fade after the baby is born.  You may have changes in your hair. These can include thickening of your hair, rapid growth, and changes in texture. Some women also have hair loss during or after pregnancy, or hair that feels  dry or thin. Your hair will most likely return to normal after your baby is born. What to expect at prenatal visits During a routine prenatal visit:  You will be weighed to make sure you and the fetus are growing normally.  Your blood pressure will be taken.  Your abdomen will be measured to track your baby's growth.  The fetal heartbeat will be listened to.  Any test results from the previous visit will be discussed. Your health care provider may ask you:  How you are feeling.  If you are feeling the baby move.  If you have had any abnormal symptoms, such as leaking fluid, bleeding, severe headaches, or abdominal cramping.  If you are using any tobacco products, including cigarettes, chewing tobacco, and electronic cigarettes.  If you have any questions. Other tests that may be performed during your second trimester include:  Blood tests that check for:  Low iron levels (anemia).  High blood sugar that affects pregnant women (gestational diabetes) between 70 and 28 weeks.  Rh antibodies. This is to check for a protein on red blood cells (Rh factor).  Urine tests to check for infections, diabetes, or protein in the urine.  An ultrasound to confirm the proper growth and development of the baby.  An amniocentesis to check for possible genetic problems.  Fetal screens for spina bifida and Down syndrome.  HIV (human immunodeficiency virus) testing. Routine prenatal testing includes screening for HIV, unless you choose not to have this test. Follow these instructions at home: Medicines   Follow your health care provider's instructions regarding medicine use. Specific medicines may be either safe or unsafe to take during pregnancy.  Take a prenatal vitamin that contains at least 600 micrograms (mcg) of folic acid.  If you develop constipation, try taking a stool softener if your health care provider approves. Eating and drinking   Eat a balanced diet that includes fresh  fruits and vegetables, whole grains, good sources of protein such as meat, eggs, or tofu, and low-fat dairy. Your health care provider will help you determine the amount of weight gain that is right for you.  Avoid raw meat and uncooked cheese. These carry germs that can cause birth defects in the baby.  If you have low calcium intake from food, talk to your health care provider about whether you should take a daily calcium supplement.  Limit foods that are high in fat and processed sugars, such as fried and sweet foods.  To prevent constipation:  Drink enough fluid to keep your urine clear or pale yellow.  Eat foods that are high in fiber, such as fresh fruits and vegetables, whole grains, and beans. Activity   Exercise only as directed by your health care provider. Most women can continue their usual exercise routine during pregnancy. Try to exercise for 30 minutes at least 5 days a week. Stop exercising if you experience uterine contractions.  Avoid heavy lifting, wear low heel shoes, and practice good posture.  A  sexual relationship may be continued unless your health care provider directs you otherwise. Relieving pain and discomfort   Wear a good support bra to prevent discomfort from breast tenderness.  Take warm sitz baths to soothe any pain or discomfort caused by hemorrhoids. Use hemorrhoid cream if your health care provider approves.  Rest with your legs elevated if you have leg cramps or low back pain.  If you develop varicose veins, wear support hose. Elevate your feet for 15 minutes, 3-4 times a day. Limit salt in your diet. Prenatal Care   Write down your questions. Take them to your prenatal visits.  Keep all your prenatal visits as told by your health care provider. This is important. Safety   Wear your seat belt at all times when driving.  Make a list of emergency phone numbers, including numbers for family, friends, the hospital, and police and fire  departments. General instructions   Ask your health care provider for a referral to a local prenatal education class. Begin classes no later than the beginning of month 6 of your pregnancy.  Ask for help if you have counseling or nutritional needs during pregnancy. Your health care provider can offer advice or refer you to specialists for help with various needs.  Do not use hot tubs, steam rooms, or saunas.  Do not douche or use tampons or scented sanitary pads.  Do not cross your legs for long periods of time.  Avoid cat litter boxes and soil used by cats. These carry germs that can cause birth defects in the baby and possibly loss of the fetus by miscarriage or stillbirth.  Avoid all smoking, herbs, alcohol, and unprescribed drugs. Chemicals in these products can affect the formation and growth of the baby.  Do not use any products that contain nicotine or tobacco, such as cigarettes and e-cigarettes. If you need help quitting, ask your health care provider.  Visit your dentist if you have not gone yet during your pregnancy. Use a soft toothbrush to brush your teeth and be gentle when you floss. Contact a health care provider if:  You have dizziness.  You have mild pelvic cramps, pelvic pressure, or nagging pain in the abdominal area.  You have persistent nausea, vomiting, or diarrhea.  You have a bad smelling vaginal discharge.  You have pain when you urinate. Get help right away if:  You have a fever.  You are leaking fluid from your vagina.  You have spotting or bleeding from your vagina.  You have severe abdominal cramping or pain.  You have rapid weight gain or weight loss.  You have shortness of breath with chest pain.  You notice sudden or extreme swelling of your face, hands, ankles, feet, or legs.  You have not felt your baby move in over an hour.  You have severe headaches that do not go away when you take medicine.  You have vision  changes. Summary  The second trimester is from week 14 through week 27 (months 4 through 6). It is also a time when the fetus is growing rapidly.  Your body goes through many changes during pregnancy. The changes vary from woman to woman.  Avoid all smoking, herbs, alcohol, and unprescribed drugs. These chemicals affect the formation and growth your baby.  Do not use any tobacco products, such as cigarettes, chewing tobacco, and e-cigarettes. If you need help quitting, ask your health care provider.  Contact your health care provider if you have any questions. Keep all  prenatal visits as told by your health care provider. This is important. This information is not intended to replace advice given to you by your health care provider. Make sure you discuss any questions you have with your health care provider. Document Released: 07/30/2001 Document Revised: 01/11/2016 Document Reviewed: 10/06/2012 Elsevier Interactive Patient Education  2017 [employer].

## 2016-12-05 NOTE — Progress Notes (Signed)
Subjective:   Katie Mayer is a 26 y.o. G2P1001 at [redacted]w[redacted]d by LMP being seen today for her first obstetrical visit.  Her obstetrical history is significant for term SVD, Hemoglobin S trait. Patient does intend to breast feed. Pregnancy history fully reviewed. She is part of the [employer] program.  Patient reports no complaints.  HISTORY: Obstetric History   G2   P1   T1   P0   A0   L1    SAB0   TAB0   Ectopic0   Multiple0   Live Births1     # Outcome Date GA Lbr Len/2nd Weight Sex Delivery Anes PTL Lv  2 Current           1 Term 01/20/16 [redacted]w[redacted]d / 00:49 6 lb 15.5 oz (3.161 kg) M Vag-Spont EPI  LIV     Name: HAMZA SALOUM,AYMAN SOULEYMANE     Apgar1:  8                Apgar5: 9     Past Medical History:  Diagnosis Date  . Anemia   . 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 HPV (human papilloma virus) anogenital infection    History reviewed. No pertinent surgical history. Family History  Problem Relation Age of Onset  . Hypertension Mother   . Hypertension Father    Social History  Substance Use Topics  . Smoking status: Never Smoker  . Smokeless tobacco: Not on file  . Alcohol use No   No Known Allergies Current Outpatient Prescriptions on File Prior to Visit  Medication Sig Dispense Refill  . Prenatal Vit-Fe Fumarate-FA (MULTIVITAMIN-PRENATAL) 27-0.8 MG TABS tablet Take 1 tablet by mouth daily at 12 noon.    . docusate sodium (COLACE) 100 MG capsule Take 1 capsule (100 mg total) by mouth 2 (two) times daily. (Patient not taking: Reported on 12/05/2016) 30 capsule 0  . ibuprofen (ADVIL,MOTRIN) 600 MG tablet Take 1 tablet (600 mg total) by mouth every 6 (six) hours. (Patient not taking: Reported on 12/05/2016) 30 tablet 0  . omeprazole (PRILOSEC) 20 MG capsule Take 20 mg by mouth daily.  1  . oxyCODONE-acetaminophen (PERCOCET/ROXICET) 5-325 MG tablet Take 1 tablet by mouth every 8 (eight) hours as needed for severe pain (Please take only after  trialing ibuprofen or  tylenol). (Patient not taking: Reported on 12/05/2016) 10 tablet 0  . simethicone (MYLICON) 80 MG chewable tablet Chew 1 tablet (80 mg total) by mouth once. (Patient not taking: Reported on 12/05/2016) 30 tablet 0   No current facility-administered medications on file prior to visit.      Exam   Vitals:   12/05/16 0841  BP: 108/69  Pulse: 89   Fetal Heart Rate (bpm): 160  Uterus:  Fundal Height: 18 cm.    System: General: well-developed, well-nourished female in no acute distress   Breast:  normal appearance, no masses or tenderness   Skin: normal coloration and turgor, no rashes   Neurologic: oriented, normal, negative, normal mood   Extremities: normal strength, tone, and muscle mass, ROM of all joints is normal   HEENT PERRLA, extraocular movement intact and sclera clear, anicteric   Mouth/Teeth mucous membranes moist, pharynx normal without lesions and dental hygiene good   Neck supple and no masses   Cardiovascular: regular rate and rhythm   Respiratory:  no respiratory distress, normal breath sounds   Abdomen: soft, non-tender; bowel sounds normal; no masses,  no organomegaly   Pelvic: Deferred.  Had normal pap in 07/2015.      Assessment:   Pregnancy: G2P1001 Patient Active Problem List   Diagnosis Date Noted  . Supervision of other normal pregnancy, antepartum 12/05/2016  . 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 Clinic ultrasound done to confirm viability today.  - US OB Limited - POCT urinalysis dip (device) - Obstetric Panel, Including HIV - Culture, OB Urine - Urine cytology ancillary only - AFP, Quad Screen - US OB Comp + 14 Wk; Future Continue prenatal vitamins. Genetic Screening discussed, Quad screen: ordered. Ultrasound discussed; fetal anatomic survey: ordered at the [facility]. Problem list reviewed and updated. The nature of [place] [facility] with multiple MDs and other Advanced Practice Providers was explained to patient; also emphasized that residents, students are part of our team. Routine obstetric precautions reviewed. Return in about 4 weeks (around 01/02/2017) for OB Visit.     Jaynie Collins, MD, FACOG Attending Obstetrician & Gynecologist, [facility] for [employer], [facility]

## 2016-12-07 LAB — URINE CULTURE, OB REFLEX

## 2016-12-07 LAB — CULTURE, OB URINE

## 2016-12-11 LAB — OBSTETRIC PANEL, INCLUDING HIV
Antibody Screen: NEGATIVE
BASOS ABS: 0 10*3/uL (ref 0.0–0.2)
BASOS: 0 %
EOS (ABSOLUTE): 0.1 10*3/uL (ref 0.0–0.4)
EOS: 1 %
HEMATOCRIT: 34.2 % (ref 34.0–46.6)
HEMOGLOBIN: 11.5 g/dL (ref 11.1–15.9)
HEP B S AG: NEGATIVE
HIV Screen 4th Generation wRfx: NONREACTIVE
IMMATURE GRANULOCYTES: 0 %
Immature Grans (Abs): 0 10*3/uL (ref 0.0–0.1)
LYMPHS: 38 %
Lymphocytes Absolute: 1.5 10*3/uL (ref 0.7–3.1)
MCH: 26.1 pg — ABNORMAL LOW (ref 26.6–33.0)
MCHC: 33.6 g/dL (ref 31.5–35.7)
MCV: 78 fL — ABNORMAL LOW (ref 79–97)
MONOCYTES: 7 %
Monocytes Absolute: 0.3 10*3/uL (ref 0.1–0.9)
NEUTROS PCT: 54 %
Neutrophils Absolute: 2.1 10*3/uL (ref 1.4–7.0)
Platelets: 245 10*3/uL (ref 150–379)
RBC: 4.41 x10E6/uL (ref 3.77–5.28)
RDW: 14.8 % (ref 12.3–15.4)
RH TYPE: POSITIVE
RPR: NONREACTIVE
Rubella Antibodies, IGG: 13.7 index (ref >0.99)
WBC: 3.9 10*3/uL (ref 3.4–10.8)

## 2016-12-11 LAB — AFP, QUAD SCREEN
DIA Mom Value: 0.38
DIA VALUE (EIA): 61.61 pg/mL
DSR (By Age)    1 IN: 960
DSR (Second Trimester) 1 IN: 10000
GESTATIONAL AGE AFP: 15.3 wk
MSAFP MOM: 0.75
MSAFP: 21.5 ng/mL
MSHCG Mom: 0.83
MSHCG: 35095 m[IU]/mL
Maternal Age At EDD: 26.4 yr
OSB RISK: 10000
T18 (By Age): 1:3740 {titer}
Test Results:: NEGATIVE
UE3 MOM: 1.32
WEIGHT: 178 [lb_av]
uE3 Value: 0.77 ng/mL

## 2017-01-02 ENCOUNTER — Encounter: Payer: Self-pay | Admitting: Advanced Practice Midwife

## 2017-01-02 ENCOUNTER — Ambulatory Visit (INDEPENDENT_AMBULATORY_CARE_PROVIDER_SITE_OTHER): Payer: BLUE CROSS/BLUE SHIELD | Admitting: Advanced Practice Midwife

## 2017-01-02 VITALS — BP 112/65 | HR 87

## 2017-01-02 DIAGNOSIS — R059 Cough, unspecified: Secondary | ICD-10-CM

## 2017-01-02 DIAGNOSIS — R05 Cough: Secondary | ICD-10-CM | POA: Insufficient documentation

## 2017-01-02 DIAGNOSIS — B373 Candidiasis of vulva and vagina: Secondary | ICD-10-CM

## 2017-01-02 DIAGNOSIS — Z3480 Encounter for supervision of other normal pregnancy, unspecified trimester: Secondary | ICD-10-CM

## 2017-01-02 DIAGNOSIS — Z3482 Encounter for supervision of other normal pregnancy, second trimester: Secondary | ICD-10-CM

## 2017-01-02 DIAGNOSIS — Z113 Encounter for screening for infections with a predominantly sexual mode of transmission: Secondary | ICD-10-CM | POA: Diagnosis not present

## 2017-01-02 DIAGNOSIS — B3731 Acute candidiasis of vulva and vagina: Secondary | ICD-10-CM

## 2017-01-02 MED ORDER — TERCONAZOLE 0.4 % VA CREA: 1.0000 | TOPICAL_CREAM | Freq: Every day | VAGINAL | 0 refills | Status: DC

## 2017-01-02 MED ORDER — GUAIFENESIN ER 600 MG PO TB12: 600.0000 mg | ORAL_TABLET | Freq: Two times a day (BID) | ORAL | 1 refills | Status: DC

## 2017-01-02 NOTE — Progress Notes (Signed)
C/o thick white vaginal discharge and itching sometimes. Declines flu shot. C/o cough - sometimes makes her vomit.

## 2017-01-02 NOTE — Progress Notes (Addendum)
   PRENATAL VISIT NOTE  Subjective:  Katie Mayer is a 26 y.o. G2P1001 at 3434w5d being seen today for ongoing prenatal care.  Katie Mayer is currently monitored for the following issues for this low-risk pregnancy and has Supervision of other normal pregnancy, antepartum and Hemoglobin S trait (HCC) on her problem list.  Patient reports vaginal irritation and cough in am, little sputum production, no fever or night sweats.  Contractions: Not present. Vag. Bleeding: None.  Movement: Absent. Denies leaking of fluid.   The following portions of the patient's history were reviewed and updated as appropriate: allergies, current medications, past family history, past medical history, past social history, past surgical history and problem list. Problem list updated.  Objective:   Vitals:   01/02/17 0927  BP: 112/65  Pulse: 87    Fetal Status: Fetal Heart Rate (bpm): 153   Movement: Absent     General:  Alert, oriented and cooperative. Patient is in no acute distress.  Skin: Skin is warm and dry. No rash noted.   Cardiovascular: Normal heart rate noted  Respiratory: Normal respiratory effort, no problems with respiration noted Lungs CTAB, no wheezes or crackles  Abdomen: Soft, gravid, appropriate for gestational age. Pain/Pressure: Present     Pelvic:  vaginal irritation and yeast infection, cough for 2 wks with little sputum and no fever        Extremities: Normal range of motion.  Edema: None  Mental Status: Normal mood and affect. Normal behavior. Normal judgment and thought content.   Assessment and Plan:  Pregnancy: G2P1001 at 5734w5d  1. Supervision of other normal pregnancy, antepartum      Rx Terazol 7 for probable yeast vaginitis       - GC/Chlamydia probe amp ([place])not at [facility]  2.    Cough       Rx Mucinex for cough.  Warned to call if develops wheezing or fever  Preterm labor symptoms and general obstetric precautions including but not limited to vaginal bleeding,  contractions, leaking of fluid and fetal movement were reviewed in detail with the patient. Please refer to After Visit Summary for other counseling recommendations.   RTC 4 weeks  Wynelle BourgeoisMarie Jacklin Zwick, CNM

## 2017-01-03 LAB — GC/CHLAMYDIA PROBE AMP (~~LOC~~) NOT AT ARMC
Chlamydia: NEGATIVE
Neisseria Gonorrhea: NEGATIVE

## 2017-01-03 NOTE — Patient Instructions (Signed)
Cough, Adult Coughing is a reflex that clears your throat and your airways. Coughing helps to heal and protect your lungs. It is normal to cough occasionally, but a cough that happens with other symptoms or lasts a long time may be a sign of a condition that needs treatment. A cough may last only 2-3 weeks (acute), or it may last longer than 8 weeks (chronic). What are the causes? Coughing is commonly caused by:  Breathing in substances that irritate your lungs.  A viral or bacterial respiratory infection.  Allergies.  Asthma.  Postnasal drip.  Smoking.  Acid backing up from the stomach into the esophagus (gastroesophageal reflux).  Certain medicines.  Chronic lung problems, including COPD (or rarely, lung cancer).  Other medical conditions such as heart failure.  Follow these instructions at home: Pay attention to any changes in your symptoms. Take these actions to help with your discomfort:  Take medicines only as told by your health care provider. ? If you were prescribed an antibiotic medicine, take it as told by your health care provider. Do not stop taking the antibiotic even if you start to feel better. ? Talk with your health care provider before you take a cough suppressant medicine.  Drink enough fluid to keep your urine clear or pale yellow.  If the air is dry, use a cold steam vaporizer or humidifier in your bedroom or your home to help loosen secretions.  Avoid anything that causes you to cough at work or at home.  If your cough is worse at night, try sleeping in a semi-upright position.  Avoid cigarette smoke. If you smoke, quit smoking. If you need help quitting, ask your health care provider.  Avoid caffeine.  Avoid alcohol.  Rest as needed.  Contact a health care provider if:  You have new symptoms.  You cough up pus.  Your cough does not get better after 2-3 weeks, or your cough gets worse.  You cannot control your cough with suppressant  medicines and you are losing sleep.  You develop pain that is getting worse or pain that is not controlled with pain medicines.  You have a fever.  You have unexplained weight loss.  You have night sweats. Get help right away if:  You cough up blood.  You have difficulty breathing.  Your heartbeat is very fast. This information is not intended to replace advice given to you by your health care provider. Make sure you discuss any questions you have with your health care provider. Document Released: 02/01/2011 Document Revised: 01/11/2016 Document Reviewed: 10/12/2014 Elsevier Interactive Patient Education  2017 Elsevier Inc.  

## 2017-01-20 ENCOUNTER — Ambulatory Visit (HOSPITAL_COMMUNITY): Payer: Self-pay

## 2017-01-21 ENCOUNTER — Other Ambulatory Visit: Payer: Self-pay | Admitting: Advanced Practice Midwife

## 2017-01-21 ENCOUNTER — Ambulatory Visit (HOSPITAL_COMMUNITY)
Admission: RE | Admit: 2017-01-21 | Discharge: 2017-01-21 | Disposition: A | Discharge status: Home / Self Care | Payer: BLUE CROSS/BLUE SHIELD | Source: Ambulatory Visit | Attending: Advanced Practice Midwife | Admitting: Advanced Practice Midwife

## 2017-01-21 DIAGNOSIS — O99210 Obesity complicating pregnancy, unspecified trimester: Secondary | ICD-10-CM | POA: Insufficient documentation

## 2017-01-21 DIAGNOSIS — Z3689 Encounter for other specified antenatal screening: Secondary | ICD-10-CM | POA: Insufficient documentation

## 2017-01-21 DIAGNOSIS — Z3A21 21 weeks gestation of pregnancy: Secondary | ICD-10-CM | POA: Diagnosis not present

## 2017-01-21 DIAGNOSIS — Z3480 Encounter for supervision of other normal pregnancy, unspecified trimester: Secondary | ICD-10-CM | POA: Insufficient documentation

## 2017-01-21 DIAGNOSIS — E669 Obesity, unspecified: Secondary | ICD-10-CM | POA: Diagnosis not present

## 2017-01-30 ENCOUNTER — Ambulatory Visit (INDEPENDENT_AMBULATORY_CARE_PROVIDER_SITE_OTHER): Payer: BLUE CROSS/BLUE SHIELD | Admitting: Medical

## 2017-01-30 DIAGNOSIS — Z3480 Encounter for supervision of other normal pregnancy, unspecified trimester: Secondary | ICD-10-CM

## 2017-01-30 DIAGNOSIS — R059 Cough, unspecified: Secondary | ICD-10-CM

## 2017-01-30 DIAGNOSIS — Z3482 Encounter for supervision of other normal pregnancy, second trimester: Secondary | ICD-10-CM

## 2017-01-30 DIAGNOSIS — R05 Cough: Secondary | ICD-10-CM

## 2017-01-30 NOTE — Patient Instructions (Signed)
Second Trimester of Pregnancy The second trimester is from week 13 through week 28, month 4 through 6. This is often the time in pregnancy that you feel your best. Often times, morning sickness has lessened or quit. You may have more energy, and you may get hungry more often. Your unborn baby (fetus) is growing rapidly. At the end of the sixth month, he or she is about 9 inches long and weighs about 1 pounds. You will likely feel the baby move (quickening) between 18 and 20 weeks of pregnancy. Follow these instructions at home:  Avoid all smoking, herbs, and alcohol. Avoid drugs not approved by your doctor.  Do not use any tobacco products, including cigarettes, chewing tobacco, and electronic cigarettes. If you need help quitting, ask your doctor. You may get counseling or other support to help you quit.  Only take medicine as told by your doctor. Some medicines are safe and some are not during pregnancy.  Exercise only as told by your doctor. Stop exercising if you start having cramps.  Eat regular, healthy meals.  Wear a good support bra if your breasts are tender.  Do not use hot tubs, steam rooms, or saunas.  Wear your seat belt when driving.  Avoid raw meat, uncooked cheese, and liter boxes and soil used by cats.  Take your prenatal vitamins.  Take 1500-2000 milligrams of calcium daily starting at the 20th week of pregnancy until you deliver your baby.  Try taking medicine that helps you poop (stool softener) as needed, and if your doctor approves. Eat more fiber by eating fresh fruit, vegetables, and whole grains. Drink enough fluids to keep your pee (urine) clear or pale yellow.  Take warm water baths (sitz baths) to soothe pain or discomfort caused by hemorrhoids. Use hemorrhoid cream if your doctor approves.  If you have puffy, bulging veins (varicose veins), wear support hose. Raise (elevate) your feet for 15 minutes, 3-4 times a day. Limit salt in your diet.  Avoid heavy  lifting, wear low heals, and sit up straight.  Rest with your legs raised if you have leg cramps or low back pain.  Visit your dentist if you have not gone during your pregnancy. Use a soft toothbrush to brush your teeth. Be gentle when you floss.  You can have sex (intercourse) unless your doctor tells you not to.  Go to your doctor visits. Get help if:  You feel dizzy.  You have mild cramps or pressure in your lower belly (abdomen).  You have a nagging pain in your belly area.  You continue to feel sick to your stomach (nauseous), throw up (vomit), or have watery poop (diarrhea).  You have bad smelling fluid coming from your vagina.  You have pain with peeing (urination). Get help right away if:  You have a fever.  You are leaking fluid from your vagina.  You have spotting or bleeding from your vagina.  You have severe belly cramping or pain.  You lose or gain weight rapidly.  You have trouble catching your breath and have chest pain.  You notice sudden or extreme puffiness (swelling) of your face, hands, ankles, feet, or legs.  You have not felt the baby move in over an hour.  You have severe headaches that do not go away with medicine.  You have vision changes. This information is not intended to replace advice given to you by your health care provider. Make sure you discuss any questions you have with your health care   provider. Document Released: 10/30/2009 Document Revised: 01/11/2016 Document Reviewed: 10/06/2012 Elsevier Interactive Patient Education  2017 Elsevier Inc.  

## 2017-01-30 NOTE — Progress Notes (Signed)
   PRENATAL VISIT NOTE  Subjective:  Katie DoomYasmina Mayer is a [age] y.o. G2P1001 at 6340w5d being seen today for ongoing prenatal care.  She is currently monitored for the following issues for this low-risk pregnancy and has Supervision of other normal pregnancy, antepartum; Hemoglobin S trait (HCC); Cough; and Yeast vaginitis on her problem list.  Patient reports no complaints.  Contractions: Not present. Vag. Bleeding: None.  Movement: Present. Denies leaking of fluid.   The following portions of the patient's history were reviewed and updated as appropriate: allergies, current medications, past family history, past medical history, past social history, past surgical history and problem list. Problem list updated.  Objective:   Vitals:   01/30/17 1044  BP: 104/61  Pulse: 83    Fetal Status: Fetal Heart Rate (bpm): 150 Fundal Height: 22 cm Movement: Present     General:  Alert, oriented and cooperative. Patient is in no acute distress.  Skin: Skin is warm and dry. No rash noted.   Cardiovascular: Normal heart rate noted  Respiratory: Normal respiratory effort, no problems with respiration noted  Abdomen: Soft, gravid, appropriate for gestational age. Pain/Pressure: Present     Pelvic:  Cervical exam deferred        Extremities: Normal range of motion.  Edema: None  Mental Status: Normal mood and affect. Normal behavior. Normal judgment and thought content.   Assessment and Plan:  Pregnancy: G2P1001 at 3940w5d  1. Supervision of other normal pregnancy, antepartum - patient doing well - States occasional tightening noted, but none x 1 week now - Denies bleeding or LOF  2. Cough - Mucinex did not help, advised to try Zyrtec or Claritin nightly for 1-2 weeks to see if this relieves dry morning cough  Preterm labor symptoms and general obstetric precautions including but not limited to vaginal bleeding, contractions, leaking of fluid and fetal movement were reviewed in detail with the  patient. Please refer to After Visit Summary for other counseling recommendations.  Return in about 4 weeks (around 02/27/2017) for LOB.   Vonzella NippleJulie Keierra Nudo, PA-C

## 2017-02-25 ENCOUNTER — Encounter: Payer: BLUE CROSS/BLUE SHIELD | Admitting: Obstetrics and Gynecology

## 2017-03-18 ENCOUNTER — Ambulatory Visit (INDEPENDENT_AMBULATORY_CARE_PROVIDER_SITE_OTHER): Payer: BLUE CROSS/BLUE SHIELD | Admitting: Student

## 2017-03-18 VITALS — BP 102/64 | HR 86 | Wt 183.1 lb

## 2017-03-18 DIAGNOSIS — Z23 Encounter for immunization: Secondary | ICD-10-CM

## 2017-03-18 DIAGNOSIS — Z3483 Encounter for supervision of other normal pregnancy, third trimester: Secondary | ICD-10-CM

## 2017-03-18 DIAGNOSIS — Z3480 Encounter for supervision of other normal pregnancy, unspecified trimester: Secondary | ICD-10-CM

## 2017-03-18 NOTE — Patient Instructions (Signed)
Third Trimester of Pregnancy The third trimester is from week 28 through week 40 (months 7 through 9). The third trimester is a time when the unborn baby (fetus) is growing rapidly. At the end of the ninth month, the fetus is about 20 inches in length and weighs 6-10 pounds. Body changes during your third trimester Your body will continue to go through many changes during pregnancy. The changes vary from woman to woman. During the third trimester:  Your weight will continue to increase. You can expect to gain 25-35 pounds (11-16 kg) by the end of the pregnancy.  You may begin to get stretch marks on your hips, abdomen, and breasts.  You may urinate more often because the fetus is moving lower into your pelvis and pressing on your bladder.  You may develop or continue to have heartburn. This is caused by increased hormones that slow down muscles in the digestive tract.  You may develop or continue to have constipation because increased hormones slow digestion and cause the muscles that push waste through your intestines to relax.  You may develop hemorrhoids. These are swollen veins (varicose veins) in the rectum that can itch or be painful.  You may develop swollen, bulging veins (varicose veins) in your legs.  You may have increased body aches in the pelvis, back, or thighs. This is due to weight gain and increased hormones that are relaxing your joints.  You may have changes in your hair. These can include thickening of your hair, rapid growth, and changes in texture. Some women also have hair loss during or after pregnancy, or hair that feels dry or thin. Your hair will most likely return to normal after your baby is born.  Your breasts will continue to grow and they will continue to become tender. A yellow fluid (colostrum) may leak from your breasts. This is the first milk you are producing for your baby.  Your belly button may stick out.  You may notice more swelling in your hands,  face, or ankles.  You may have increased tingling or numbness in your hands, arms, and legs. The skin on your belly may also feel numb.  You may feel short of breath because of your expanding uterus.  You may have more problems sleeping. This can be caused by the size of your belly, increased need to urinate, and an increase in your body's metabolism.  You may notice the fetus "dropping," or moving lower in your abdomen (lightening).  You may have increased vaginal discharge.  You may notice your joints feel loose and you may have pain around your pelvic bone.  What to expect at prenatal visits You will have prenatal exams every 2 weeks until week 36. Then you will have weekly prenatal exams. During a routine prenatal visit:  You will be weighed to make sure you and the baby are growing normally.  Your blood pressure will be taken.  Your abdomen will be measured to track your baby's growth.  The fetal heartbeat will be listened to.  Any test results from the previous visit will be discussed.  You may have a cervical check near your due date to see if your cervix has softened or thinned (effaced).  You will be tested for Group B streptococcus. This happens between 35 and 37 weeks.  Your health care provider may ask you:  What your birth plan is.  How you are feeling.  If you are feeling the baby move.  If you have had   any abnormal symptoms, such as leaking fluid, bleeding, severe headaches, or abdominal cramping.  If you are using any tobacco products, including cigarettes, chewing tobacco, and electronic cigarettes.  If you have any questions.  Other tests or screenings that may be performed during your third trimester include:  Blood tests that check for low iron levels (anemia).  Fetal testing to check the health, activity level, and growth of the fetus. Testing is done if you have certain medical conditions or if there are problems during the  pregnancy.  Nonstress test (NST). This test checks the health of your baby to make sure there are no signs of problems, such as the baby not getting enough oxygen. During this test, a belt is placed around your belly. The baby is made to move, and its heart rate is monitored during movement.  What is false labor? False labor is a condition in which you feel small, irregular tightenings of the muscles in the womb (contractions) that usually go away with rest, changing position, or drinking water. These are called Braxton Hicks contractions. Contractions may last for hours, days, or even weeks before true labor sets in. If contractions come at regular intervals, become more frequent, increase in intensity, or become painful, you should see your health care provider. What are the signs of labor?  Abdominal cramps.  Regular contractions that start at 10 minutes apart and become stronger and more frequent with time.  Contractions that start on the top of the uterus and spread down to the lower abdomen and back.  Increased pelvic pressure and dull back pain.  A watery or bloody mucus discharge that comes from the vagina.  Leaking of amniotic fluid. This is also known as your "water breaking." It could be a slow trickle or a gush. Let your health care provider know if it has a color or strange odor. If you have any of these signs, call your health care provider right away, even if it is before your due date. Follow these instructions at home: Medicines  Follow your health care provider's instructions regarding medicine use. Specific medicines may be either safe or unsafe to take during pregnancy.  Take a prenatal vitamin that contains at least 600 micrograms (mcg) of folic acid.  If you develop constipation, try taking a stool softener if your health care provider approves. Eating and drinking  Eat a balanced diet that includes fresh fruits and vegetables, whole grains, good sources of protein  such as meat, eggs, or tofu, and low-fat dairy. Your health care provider will help you determine the amount of weight gain that is right for you.  Avoid raw meat and uncooked cheese. These carry germs that can cause birth defects in the baby.  If you have low calcium intake from food, talk to your health care provider about whether you should take a daily calcium supplement.  Eat four or five small meals rather than three large meals a day.  Limit foods that are high in fat and processed sugars, such as fried and sweet foods.  To prevent constipation: ? Drink enough fluid to keep your urine clear or pale yellow. ? Eat foods that are high in fiber, such as fresh fruits and vegetables, whole grains, and beans. Activity  Exercise only as directed by your health care provider. Most women can continue their usual exercise routine during pregnancy. Try to exercise for 30 minutes at least 5 days a week. Stop exercising if you experience uterine contractions.  Avoid heavy   lifting.  Do not exercise in extreme heat or humidity, or at high altitudes.  Wear low-heel, comfortable shoes.  Practice good posture.  You may continue to have sex unless your health care provider tells you otherwise. Relieving pain and discomfort  Take frequent breaks and rest with your legs elevated if you have leg cramps or low back pain.  Take warm sitz baths to soothe any pain or discomfort caused by hemorrhoids. Use hemorrhoid cream if your health care provider approves.  Wear a good support bra to prevent discomfort from breast tenderness.  If you develop varicose veins: ? Wear support pantyhose or compression stockings as told by your healthcare provider. ? Elevate your feet for 15 minutes, 3-4 times a day. Prenatal care  Write down your questions. Take them to your prenatal visits.  Keep all your prenatal visits as told by your health care provider. This is important. Safety  Wear your seat belt at  all times when driving.  Make a list of emergency phone numbers, including numbers for family, friends, the hospital, and police and fire departments. General instructions  Avoid cat litter boxes and soil used by cats. These carry germs that can cause birth defects in the baby. If you have a cat, ask someone to clean the litter box for you.  Do not travel far distances unless it is absolutely necessary and only with the approval of your health care provider.  Do not use hot tubs, steam rooms, or saunas.  Do not drink alcohol.  Do not use any products that contain nicotine or tobacco, such as cigarettes and e-cigarettes. If you need help quitting, ask your health care provider.  Do not use any medicinal herbs or unprescribed drugs. These chemicals affect the formation and growth of the baby.  Do not douche or use tampons or scented sanitary pads.  Do not cross your legs for long periods of time.  To prepare for the arrival of your baby: ? Take prenatal classes to understand, practice, and ask questions about labor and delivery. ? Make a trial run to the hospital. ? Visit the hospital and tour the maternity area. ? Arrange for maternity or paternity leave through employers. ? Arrange for family and friends to take care of pets while you are in the hospital. ? Purchase a rear-facing car seat and make sure you know how to install it in your car. ? Pack your hospital bag. ? Prepare the baby's nursery. Make sure to remove all pillows and stuffed animals from the baby's crib to prevent suffocation.  Visit your dentist if you have not gone during your pregnancy. Use a soft toothbrush to brush your teeth and be gentle when you floss. Contact a health care provider if:  You are unsure if you are in labor or if your water has broken.  You become dizzy.  You have mild pelvic cramps, pelvic pressure, or nagging pain in your abdominal area.  You have lower back pain.  You have persistent  nausea, vomiting, or diarrhea.  You have an unusual or bad smelling vaginal discharge.  You have pain when you urinate. Get help right away if:  Your water breaks before 37 weeks.  You have regular contractions less than 5 minutes apart before 37 weeks.  You have a fever.  You are leaking fluid from your vagina.  You have spotting or bleeding from your vagina.  You have severe abdominal pain or cramping.  You have rapid weight loss or weight gain.    You have shortness of breath with chest pain.  You notice sudden or extreme swelling of your face, hands, ankles, feet, or legs.  Your baby makes fewer than 10 movements in 2 hours.  You have severe headaches that do not go away when you take medicine.  You have vision changes. Summary  The third trimester is from week 28 through week 40, months 7 through 9. The third trimester is a time when the unborn baby (fetus) is growing rapidly.  During the third trimester, your discomfort may increase as you and your baby continue to gain weight. You may have abdominal, leg, and back pain, sleeping problems, and an increased need to urinate.  During the third trimester your breasts will keep growing and they will continue to become tender. A yellow fluid (colostrum) may leak from your breasts. This is the first milk you are producing for your baby.  False labor is a condition in which you feel small, irregular tightenings of the muscles in the womb (contractions) that eventually go away. These are called Braxton Hicks contractions. Contractions may last for hours, days, or even weeks before true labor sets in.  Signs of labor can include: abdominal cramps; regular contractions that start at 10 minutes apart and become stronger and more frequent with time; watery or bloody mucus discharge that comes from the vagina; increased pelvic pressure and dull back pain; and leaking of amniotic fluid. This information is not intended to replace advice  given to you by your health care provider. Make sure you discuss any questions you have with your health care provider. Document Released: 07/30/2001 Document Revised: 01/11/2016 Document Reviewed: 10/06/2012 Elsevier Interactive Patient Education  2017 [employer]  

## 2017-03-18 NOTE — Progress Notes (Signed)
   PRENATAL VISIT NOTE  Subjective:  Katie Mayer is a 26 y.o. G2P1001 at 3283w3d being seen today for ongoing prenatal care.  She is currently monitored for the following issues for this low-risk pregnancy and has Supervision of other normal pregnancy, antepartum; Hemoglobin S trait (HCC); Cough; and Yeast vaginitis on her problem list.  Patient reports no complaints.  Contractions: Irregular. Vag. Bleeding: None.  Movement: Present. Denies leaking of fluid.   The following portions of the patient's history were reviewed and updated as appropriate: allergies, current medications, past family history, past medical history, past social history, past surgical history and problem list. Problem list updated.  Objective:   Vitals:   03/18/17 1118  BP: 102/64  Pulse: 86  Weight: 183 lb 1.6 oz (83.1 kg)    Fetal Status: Fetal Heart Rate (bpm): 150 Fundal Height: 28 cm Movement: Present     General:  Alert, oriented and cooperative. Patient is in no acute distress.  Skin: Skin is warm and dry. No rash noted.   Cardiovascular: Normal heart rate noted  Respiratory: Normal respiratory effort, no problems with respiration noted  Abdomen: Soft, gravid, appropriate for gestational age.  Pain/Pressure: Present     Pelvic: Cervical exam deferred        Extremities: Normal range of motion.  Edema: None  Mental Status:  Normal mood and affect. Normal behavior. Normal judgment and thought content.   Assessment and Plan:  Pregnancy: G2P1001 at 3583w3d  1. Encounter for supervision of other normal pregnancy in third trimester -pt not fasting for 2hr gtt -- will reschedule - CBC - RPR - HIV antibody (with reflex) - Tdap vaccine greater than or equal to 7yo IM  2. Supervision of other normal pregnancy, antepartum   Preterm labor symptoms and general obstetric precautions including but not limited to vaginal bleeding, contractions, leaking of fluid and fetal movement were reviewed in detail  with the patient. Please refer to After Visit Summary for other counseling recommendations.  Return in about 2 weeks (around 04/01/2017) for Routine OB.   Judeth HornErin Adhira Jamil, NP

## 2017-03-19 LAB — CBC
HEMOGLOBIN: 11.2 g/dL (ref 11.1–15.9)
Hematocrit: 33.9 % — ABNORMAL LOW (ref 34.0–46.6)
MCH: 26.5 pg — AB (ref 26.6–33.0)
MCHC: 33 g/dL (ref 31.5–35.7)
MCV: 80 fL (ref 79–97)
PLATELETS: 186 10*3/uL (ref 150–379)
RBC: 4.22 x10E6/uL (ref 3.77–5.28)
RDW: 15 % (ref 12.3–15.4)
WBC: 4.5 10*3/uL (ref 3.4–10.8)

## 2017-03-19 LAB — RPR: RPR: NONREACTIVE

## 2017-03-19 LAB — HIV ANTIBODY (ROUTINE TESTING W REFLEX): HIV Screen 4th Generation wRfx: NONREACTIVE

## 2017-03-20 ENCOUNTER — Other Ambulatory Visit: Payer: BLUE CROSS/BLUE SHIELD

## 2017-03-20 DIAGNOSIS — Z3480 Encounter for supervision of other normal pregnancy, unspecified trimester: Secondary | ICD-10-CM

## 2017-03-21 LAB — GLUCOSE TOLERANCE, 2 HOURS W/ 1HR
Glucose, 1 hour: 126 mg/dL (ref 65–179)
Glucose, 2 hour: 88 mg/dL (ref 65–152)
Glucose, Fasting: 63 mg/dL — ABNORMAL LOW (ref 65–91)

## 2017-04-01 ENCOUNTER — Ambulatory Visit (INDEPENDENT_AMBULATORY_CARE_PROVIDER_SITE_OTHER): Payer: BLUE CROSS/BLUE SHIELD | Admitting: Student

## 2017-04-01 VITALS — BP 107/65 | HR 72 | Wt 182.0 lb

## 2017-04-01 DIAGNOSIS — Z3483 Encounter for supervision of other normal pregnancy, third trimester: Secondary | ICD-10-CM

## 2017-04-01 DIAGNOSIS — Z3480 Encounter for supervision of other normal pregnancy, unspecified trimester: Secondary | ICD-10-CM

## 2017-04-01 MED ORDER — PRENATAL 27-0.8 MG PO TABS: 1.0000 | ORAL_TABLET | Freq: Every day | ORAL | 3 refills | Status: DC

## 2017-04-01 NOTE — Patient Instructions (Signed)

## 2017-04-01 NOTE — Progress Notes (Signed)
   PRENATAL VISIT NOTE  Subjective:  Katie Mayer is a 26 y.o. G2P1001 at 332w3d being seen today for ongoing prenatal care.  She is currently monitored for the following issues for this low-risk pregnancy and has Supervision of other normal pregnancy, antepartum; Hemoglobin S trait (HCC); and Cough on her problem list.  Patient reports no complaints.  Contractions: Irregular.  .  Movement: Present. Denies leaking of fluid.  Her cough is only in the morning when she wakes up. Yeast infection is healed.   The following portions of the patient's history were reviewed and updated as appropriate: allergies, current medications, past family history, past medical history, past social history, past surgical history and problem list. Problem list updated.  Objective:   Vitals:   04/01/17 0955  BP: 107/65  Pulse: 72  Weight: 182 lb (82.6 kg)    Fetal Status: Fetal Heart Rate (bpm): 152 Fundal Height: 32 cm Movement: Present     General:  Alert, oriented and cooperative. Patient is in no acute distress.  Skin: Skin is warm and dry. No rash noted.   Cardiovascular: Normal heart rate noted  Respiratory: Normal respiratory effort, no problems with respiration noted  Abdomen: Soft, gravid, appropriate for gestational age.  Pain/Pressure: Present     Pelvic: Cervical exam deferred        Extremities: Normal range of motion.  Edema: None  Mental Status:  Normal mood and affect. Normal behavior. Normal judgment and thought content.   Assessment and Plan:  Pregnancy: G2P1001 at 2532w3d  1. Supervision of other normal pregnancy, antepartum No concerns today; patient's yeast infection has been resolved.   Preterm labor symptoms and general obstetric precautions including but not limited to vaginal bleeding, contractions, leaking of fluid and fetal movement were reviewed in detail with the patient. Please refer to After Visit Summary for other counseling recommendations.  Follow up in 2  weeks  Marylene LandKathryn Lorraine Kooistra, CNM   I confirm that I have verified the information documented in the student's note and that I have also personally reperformed the physical exam and all medical decision making activities.   Luna KitchensKathryn Kooistra CNM

## 2017-04-22 ENCOUNTER — Ambulatory Visit (INDEPENDENT_AMBULATORY_CARE_PROVIDER_SITE_OTHER): Payer: BLUE CROSS/BLUE SHIELD | Admitting: Advanced Practice Midwife

## 2017-04-22 ENCOUNTER — Encounter: Payer: Self-pay | Admitting: Advanced Practice Midwife

## 2017-04-22 VITALS — BP 100/65 | HR 92 | Wt 181.3 lb

## 2017-04-22 DIAGNOSIS — O2613 Low weight gain in pregnancy, third trimester: Secondary | ICD-10-CM

## 2017-04-22 DIAGNOSIS — Z3480 Encounter for supervision of other normal pregnancy, unspecified trimester: Secondary | ICD-10-CM

## 2017-04-22 DIAGNOSIS — Z3483 Encounter for supervision of other normal pregnancy, third trimester: Secondary | ICD-10-CM

## 2017-04-22 NOTE — Progress Notes (Addendum)
   PRENATAL VISIT NOTE  Subjective:  Katie Mayer is a 26 y.o. G2P1001 at 8928w3d being seen today for ongoing prenatal care.  She is currently monitored for the following issues for this low-risk pregnancy and has Supervision of other normal pregnancy, antepartum; Hemoglobin S trait (HCC); and Cough on her problem list.  Patient reports no complaints.  Contractions: Irregular. Vag. Bleeding: None.  Movement: Present. Denies leaking of fluid.   The following portions of the patient's history were reviewed and updated as appropriate: allergies, current medications, past family history, past medical history, past social history, past surgical history and problem list. Problem list updated.  Objective:   Vitals:   04/22/17 1001  BP: 100/65  Pulse: 92  Weight: 181 lb 4.8 oz (82.2 kg)    Fetal Status: Fetal Heart Rate (bpm): 155   Movement: Present     General:  Alert, oriented and cooperative. Patient is in no acute distress.  Skin: Skin is warm and dry. No rash noted.   Cardiovascular: Normal heart rate noted  Respiratory: Normal respiratory effort, no problems with respiration noted  Abdomen: Soft, gravid, appropriate for gestational age.  Pain/Pressure: Present     Pelvic: Cervical exam deferred        Extremities: Normal range of motion.  Edema: None  Mental Status:  Normal mood and affect. Normal behavior. Normal judgment and thought content.   Assessment and Plan:  Pregnancy: G2P1001 at 5128w3d  1. Supervision of other normal pregnancy, antepartum --Anticipatory guidance about next weeks of pregnancy/next prenatal visits.  2. Poor weight gain of pregnancy, third trimester --Pt too busy to eat.  Discussed ways of increasing calories/protein.  Snack frequently, high protein snacks like nuts/yogurt.  Try supplemental shakes like Ensure as needed.  Preterm labor symptoms and general obstetric precautions including but not limited to vaginal bleeding, contractions, leaking of  fluid and fetal movement were reviewed in detail with the patient. Please refer to After Visit Summary for other counseling recommendations.  Return in about 2 weeks (around 05/06/2017).   Katie Mayer, CNM

## 2017-04-22 NOTE — Progress Notes (Signed)
Patient here today for Routine OB visit 2453w3d.

## 2017-04-22 NOTE — Patient Instructions (Signed)
Third Trimester of Pregnancy The third trimester is from week 28 through week 40 (months 7 through 9). The third trimester is a time when the unborn baby (fetus) is growing rapidly. At the end of the ninth month, the fetus is about 20 inches in length and weighs 6-10 pounds. Body changes during your third trimester Your body will continue to go through many changes during pregnancy. The changes vary from woman to woman. During the third trimester:  Your weight will continue to increase. You can expect to gain 25-35 pounds (11-16 kg) by the end of the pregnancy.  You may begin to get stretch marks on your hips, abdomen, and breasts.  You may urinate more often because the fetus is moving lower into your pelvis and pressing on your bladder.  You may develop or continue to have heartburn. This is caused by increased hormones that slow down muscles in the digestive tract.  You may develop or continue to have constipation because increased hormones slow digestion and cause the muscles that push waste through your intestines to relax.  You may develop hemorrhoids. These are swollen veins (varicose veins) in the rectum that can itch or be painful.  You may develop swollen, bulging veins (varicose veins) in your legs.  You may have increased body aches in the pelvis, back, or thighs. This is due to weight gain and increased hormones that are relaxing your joints.  You may have changes in your hair. These can include thickening of your hair, rapid growth, and changes in texture. Some women also have hair loss during or after pregnancy, or hair that feels dry or thin. Your hair will most likely return to normal after your baby is born.  Your breasts will continue to grow and they will continue to become tender. A yellow fluid (colostrum) may leak from your breasts. This is the first milk you are producing for your baby.  Your belly button may stick out.  You may notice more swelling in your hands,  face, or ankles.  You may have increased tingling or numbness in your hands, arms, and legs. The skin on your belly may also feel numb.  You may feel short of breath because of your expanding uterus.  You may have more problems sleeping. This can be caused by the size of your belly, increased need to urinate, and an increase in your body's metabolism.  You may notice the fetus "dropping," or moving lower in your abdomen (lightening).  You may have increased vaginal discharge.  You may notice your joints feel loose and you may have pain around your pelvic bone.  What to expect at prenatal visits You will have prenatal exams every 2 weeks until week 36. Then you will have weekly prenatal exams. During a routine prenatal visit:  You will be weighed to make sure you and the baby are growing normally.  Your blood pressure will be taken.  Your abdomen will be measured to track your baby's growth.  The fetal heartbeat will be listened to.  Any test results from the previous visit will be discussed.  You may have a cervical check near your due date to see if your cervix has softened or thinned (effaced).  You will be tested for Group B streptococcus. This happens between 35 and 37 weeks.  Your health care provider may ask you:  What your birth plan is.  How you are feeling.  If you are feeling the baby move.  If you have had   any abnormal symptoms, such as leaking fluid, bleeding, severe headaches, or abdominal cramping.  If you are using any tobacco products, including cigarettes, chewing tobacco, and electronic cigarettes.  If you have any questions.  Other tests or screenings that may be performed during your third trimester include:  Blood tests that check for low iron levels (anemia).  Fetal testing to check the health, activity level, and growth of the fetus. Testing is done if you have certain medical conditions or if there are problems during the  pregnancy.  Nonstress test (NST). This test checks the health of your baby to make sure there are no signs of problems, such as the baby not getting enough oxygen. During this test, a belt is placed around your belly. The baby is made to move, and its heart rate is monitored during movement.  What is false labor? False labor is a condition in which you feel small, irregular tightenings of the muscles in the womb (contractions) that usually go away with rest, changing position, or drinking water. These are called Braxton Hicks contractions. Contractions may last for hours, days, or even weeks before true labor sets in. If contractions come at regular intervals, become more frequent, increase in intensity, or become painful, you should see your health care provider. What are the signs of labor?  Abdominal cramps.  Regular contractions that start at 10 minutes apart and become stronger and more frequent with time.  Contractions that start on the top of the uterus and spread down to the lower abdomen and back.  Increased pelvic pressure and dull back pain.  A watery or bloody mucus discharge that comes from the vagina.  Leaking of amniotic fluid. This is also known as your "water breaking." It could be a slow trickle or a gush. Let your health care provider know if it has a color or strange odor. If you have any of these signs, call your health care provider right away, even if it is before your due date. Follow these instructions at home: Medicines  Follow your health care provider's instructions regarding medicine use. Specific medicines may be either safe or unsafe to take during pregnancy.  Take a prenatal vitamin that contains at least 600 micrograms (mcg) of folic acid.  If you develop constipation, try taking a stool softener if your health care provider approves. Eating and drinking  Eat a balanced diet that includes fresh fruits and vegetables, whole grains, good sources of protein  such as meat, eggs, or tofu, and low-fat dairy. Your health care provider will help you determine the amount of weight gain that is right for you.  Avoid raw meat and uncooked cheese. These carry germs that can cause birth defects in the baby.  If you have low calcium intake from food, talk to your health care provider about whether you should take a daily calcium supplement.  Eat four or five small meals rather than three large meals a day.  Limit foods that are high in fat and processed sugars, such as fried and sweet foods.  To prevent constipation: ? Drink enough fluid to keep your urine clear or pale yellow. ? Eat foods that are high in fiber, such as fresh fruits and vegetables, whole grains, and beans. Activity  Exercise only as directed by your health care provider. Most women can continue their usual exercise routine during pregnancy. Try to exercise for 30 minutes at least 5 days a week. Stop exercising if you experience uterine contractions.  Avoid heavy   lifting.  Do not exercise in extreme heat or humidity, or at high altitudes.  Wear low-heel, comfortable shoes.  Practice good posture.  You may continue to have sex unless your health care provider tells you otherwise. Relieving pain and discomfort  Take frequent breaks and rest with your legs elevated if you have leg cramps or low back pain.  Take warm sitz baths to soothe any pain or discomfort caused by hemorrhoids. Use hemorrhoid cream if your health care provider approves.  Wear a good support bra to prevent discomfort from breast tenderness.  If you develop varicose veins: ? Wear support pantyhose or compression stockings as told by your healthcare provider. ? Elevate your feet for 15 minutes, 3-4 times a day. Prenatal care  Write down your questions. Take them to your prenatal visits.  Keep all your prenatal visits as told by your health care provider. This is important. Safety  Wear your seat belt at  all times when driving.  Make a list of emergency phone numbers, including numbers for family, friends, the hospital, and police and fire departments. General instructions  Avoid cat litter boxes and soil used by cats. These carry germs that can cause birth defects in the baby. If you have a cat, ask someone to clean the litter box for you.  Do not travel far distances unless it is absolutely necessary and only with the approval of your health care provider.  Do not use hot tubs, steam rooms, or saunas.  Do not drink alcohol.  Do not use any products that contain nicotine or tobacco, such as cigarettes and e-cigarettes. If you need help quitting, ask your health care provider.  Do not use any medicinal herbs or unprescribed drugs. These chemicals affect the formation and growth of the baby.  Do not douche or use tampons or scented sanitary pads.  Do not cross your legs for long periods of time.  To prepare for the arrival of your baby: ? Take prenatal classes to understand, practice, and ask questions about labor and delivery. ? Make a trial run to the hospital. ? Visit the hospital and tour the maternity area. ? Arrange for maternity or paternity leave through employers. ? Arrange for family and friends to take care of pets while you are in the hospital. ? Purchase a rear-facing car seat and make sure you know how to install it in your car. ? Pack your hospital bag. ? Prepare the baby's nursery. Make sure to remove all pillows and stuffed animals from the baby's crib to prevent suffocation.  Visit your dentist if you have not gone during your pregnancy. Use a soft toothbrush to brush your teeth and be gentle when you floss. Contact a health care provider if:  You are unsure if you are in labor or if your water has broken.  You become dizzy.  You have mild pelvic cramps, pelvic pressure, or nagging pain in your abdominal area.  You have lower back pain.  You have persistent  nausea, vomiting, or diarrhea.  You have an unusual or bad smelling vaginal discharge.  You have pain when you urinate. Get help right away if:  Your water breaks before 37 weeks.  You have regular contractions less than 5 minutes apart before 37 weeks.  You have a fever.  You are leaking fluid from your vagina.  You have spotting or bleeding from your vagina.  You have severe abdominal pain or cramping.  You have rapid weight loss or weight gain.    You have shortness of breath with chest pain.  You notice sudden or extreme swelling of your face, hands, ankles, feet, or legs.  Your baby makes fewer than 10 movements in 2 hours.  You have severe headaches that do not go away when you take medicine.  You have vision changes. Summary  The third trimester is from week 28 through week 40, months 7 through 9. The third trimester is a time when the unborn baby (fetus) is growing rapidly.  During the third trimester, your discomfort may increase as you and your baby continue to gain weight. You may have abdominal, leg, and back pain, sleeping problems, and an increased need to urinate.  During the third trimester your breasts will keep growing and they will continue to become tender. A yellow fluid (colostrum) may leak from your breasts. This is the first milk you are producing for your baby.  False labor is a condition in which you feel small, irregular tightenings of the muscles in the womb (contractions) that eventually go away. These are called Braxton Hicks contractions. Contractions may last for hours, days, or even weeks before true labor sets in.  Signs of labor can include: abdominal cramps; regular contractions that start at 10 minutes apart and become stronger and more frequent with time; watery or bloody mucus discharge that comes from the vagina; increased pelvic pressure and dull back pain; and leaking of amniotic fluid. This information is not intended to replace advice  given to you by your health care provider. Make sure you discuss any questions you have with your health care provider. Document Released: 07/30/2001 Document Revised: 01/11/2016 Document Reviewed: 10/06/2012 Elsevier Interactive Patient Education  2017 [employer]  

## 2017-05-06 ENCOUNTER — Ambulatory Visit (INDEPENDENT_AMBULATORY_CARE_PROVIDER_SITE_OTHER): Payer: BLUE CROSS/BLUE SHIELD | Admitting: Family Medicine

## 2017-05-06 ENCOUNTER — Encounter: Payer: Self-pay | Admitting: Family Medicine

## 2017-05-06 VITALS — BP 102/70 | HR 86 | Wt 185.2 lb

## 2017-05-06 DIAGNOSIS — Z3483 Encounter for supervision of other normal pregnancy, third trimester: Secondary | ICD-10-CM

## 2017-05-06 DIAGNOSIS — Z113 Encounter for screening for infections with a predominantly sexual mode of transmission: Secondary | ICD-10-CM

## 2017-05-06 DIAGNOSIS — O99013 Anemia complicating pregnancy, third trimester: Secondary | ICD-10-CM

## 2017-05-06 DIAGNOSIS — Z3480 Encounter for supervision of other normal pregnancy, unspecified trimester: Secondary | ICD-10-CM

## 2017-05-06 DIAGNOSIS — D573 Sickle-cell trait: Secondary | ICD-10-CM

## 2017-05-06 LAB — OB RESULTS CONSOLE GBS: STREP GROUP B AG: NEGATIVE

## 2017-05-06 MED ORDER — TERCONAZOLE 0.4 % VA CREA: 1.0000 | TOPICAL_CREAM | Freq: Every day | VAGINAL | 0 refills | Status: DC

## 2017-05-06 NOTE — Progress Notes (Signed)
   PRENATAL VISIT NOTE  Subjective:  Katie Mayer is a 26 y.o. G2P1001 at 104w3d being seen today for ongoing prenatal care.  She is currently monitored for the following issues for this low-risk pregnancy and has Supervision of other normal pregnancy, antepartum and Hemoglobin S trait (HCC) on her problem list.  Patient reports no complaints.  Contractions: Irregular. Vag. Bleeding: None.  Movement: Present. Denies leaking of fluid.   The following portions of the patient's history were reviewed and updated as appropriate: allergies, current medications, past family history, past medical history, past social history, past surgical history and problem list. Problem list updated.  Objective:   Vitals:   05/06/17 0938  BP: 102/70  Pulse: 86  Weight: 185 lb 3.2 oz (84 kg)    Fetal Status: Fetal Heart Rate (bpm): 147 Fundal Height: 36 cm Movement: Present     General:  Alert, oriented and cooperative. Patient is in no acute distress.  Skin: Skin is warm and dry. No rash noted.   Cardiovascular: Normal heart rate noted  Respiratory: Normal respiratory effort, no problems with respiration noted  Abdomen: Soft, gravid, appropriate for gestational age.  Pain/Pressure: Absent     Pelvic: Cervical exam performed Dilation: 1 Effacement (%): Thick    Extremities: Normal range of motion.  Edema: None  Mental Status:  Normal mood and affect. Normal behavior. Normal judgment and thought content.   Assessment and Plan:  Pregnancy: G2P1001 at [redacted]w[redacted]d  1. Supervision of other normal pregnancy, antepartum Cultures collected today - GC/Chlamydia probe amp ([place])not at [facility] - Culture, beta strep (group b only) Routine PNC  2. Hemoglobin S trait (HCC)  Preterm labor symptoms and general obstetric precautions including but not limited to vaginal bleeding, contractions, leaking of fluid and fetal movement were reviewed in detail with the patient. Please refer to After Visit Summary for  other counseling recommendations.  Return in about 1 week (around 05/13/2017).  Raynelle Fanning P. Covey Baller, MD OB Fellow  Future Appointments Date Time Provider Department Center  05/13/2017 9:00 AM Tyiana Hill, Kandra Nicolas, MD [facility]  05/20/2017 9:00 AM Rasch, Harolyn Rutherford, NP [facility]  05/27/2017 9:40 AM Kathlene Cote [facility]  06/03/2017 9:40 AM Dorathy Kinsman, CNM [facility]

## 2017-05-06 NOTE — Progress Notes (Signed)
Patient is here today for a routine OB visit [redacted]w[redacted]d.

## 2017-05-07 LAB — GC/CHLAMYDIA PROBE AMP (~~LOC~~) NOT AT ARMC
CHLAMYDIA, DNA PROBE: NEGATIVE
NEISSERIA GONORRHEA: NEGATIVE

## 2017-05-10 LAB — CULTURE, BETA STREP (GROUP B ONLY): Strep Gp B Culture: NEGATIVE

## 2017-05-13 ENCOUNTER — Ambulatory Visit (INDEPENDENT_AMBULATORY_CARE_PROVIDER_SITE_OTHER): Payer: BLUE CROSS/BLUE SHIELD | Admitting: Family Medicine

## 2017-05-13 VITALS — BP 123/84 | HR 84 | Wt 186.0 lb

## 2017-05-13 DIAGNOSIS — D573 Sickle-cell trait: Secondary | ICD-10-CM

## 2017-05-13 DIAGNOSIS — Z3480 Encounter for supervision of other normal pregnancy, unspecified trimester: Secondary | ICD-10-CM

## 2017-05-13 NOTE — Progress Notes (Signed)
   PRENATAL VISIT NOTE  Subjective:  Katie Mayer is a 26 y.o. G2P1001 at [redacted]w[redacted]d being seen today for ongoing prenatal care.  She is currently monitored for the following issues for this low-risk pregnancy and has Supervision of other normal pregnancy, antepartum and Hemoglobin S trait (HCC) on her problem list.  Patient reports no complaints.   .  .  Movement: Present. Denies leaking of fluid.   The following portions of the patient's history were reviewed and updated as appropriate: allergies, current medications, past family history, past medical history, past social history, past surgical history and problem list. Problem list updated.  Objective:   Vitals:   05/13/17 0927  BP: 123/84  Pulse: 84  Weight: 186 lb (84.4 kg)    Fetal Status: Fetal Heart Rate (bpm): 150 Fundal Height: 37 cm Movement: Present  Presentation: Vertex  General:  Alert, oriented and cooperative. Patient is in no acute distress.  Skin: Skin is warm and dry. No rash noted.   Cardiovascular: Normal heart rate noted  Respiratory: Normal respiratory effort, no problems with respiration noted  Abdomen: Soft, gravid, appropriate for gestational age.  Pain/Pressure: Absent     Pelvic: Cervical exam deferred        Extremities: Normal range of motion.  Edema: None  Mental Status:  Normal mood and affect. Normal behavior. Normal judgment and thought content.   Assessment and Plan:  Pregnancy: G2P1001 at [redacted]w[redacted]d  1. Supervision of other normal pregnancy, antepartum Cultures from last visit reviewed. GBS negative - Routine PNC. Follow up in 1 week  2. Hemoglobin S trait (HCC)   Term labor symptoms and general obstetric precautions including but not limited to vaginal bleeding, contractions, leaking of fluid and fetal movement were reviewed in detail with the patient. Please refer to After Visit Summary for other counseling recommendations.  Return in about 1 week (around 05/20/2017) for LROB.   Frederik Pear, MD   Future Appointments Date Time Provider Department Center  05/20/2017 9:00 AM Rasch, Harolyn Rutherford, NP [facility]  05/27/2017 9:40 AM Kathlene Cote [facility]  06/03/2017 9:40 AM Dorathy Kinsman, CNM [facility]

## 2017-05-16 ENCOUNTER — Inpatient Hospital Stay (HOSPITAL_COMMUNITY): Payer: BLUE CROSS/BLUE SHIELD | Admitting: Anesthesiology

## 2017-05-16 ENCOUNTER — Encounter (HOSPITAL_COMMUNITY): Payer: Self-pay | Admitting: *Deleted

## 2017-05-16 ENCOUNTER — Inpatient Hospital Stay (HOSPITAL_COMMUNITY)
Admission: AD | Admit: 2017-05-16 | Discharge: 2017-05-18 | DRG: 775 | Disposition: A | Discharge status: Home / Self Care | Payer: BLUE CROSS/BLUE SHIELD | Source: Ambulatory Visit | Attending: Obstetrics & Gynecology | Admitting: Obstetrics & Gynecology

## 2017-05-16 DIAGNOSIS — D573 Sickle-cell trait: Secondary | ICD-10-CM | POA: Diagnosis present

## 2017-05-16 DIAGNOSIS — Z3A37 37 weeks gestation of pregnancy: Secondary | ICD-10-CM | POA: Diagnosis not present

## 2017-05-16 DIAGNOSIS — O9902 Anemia complicating childbirth: Principal | ICD-10-CM | POA: Diagnosis present

## 2017-05-16 DIAGNOSIS — Z3480 Encounter for supervision of other normal pregnancy, unspecified trimester: Secondary | ICD-10-CM

## 2017-05-16 DIAGNOSIS — O26893 Other specified pregnancy related conditions, third trimester: Secondary | ICD-10-CM | POA: Diagnosis present

## 2017-05-16 DIAGNOSIS — Z3A38 38 weeks gestation of pregnancy: Secondary | ICD-10-CM

## 2017-05-16 LAB — CBC
HEMATOCRIT: 36.6 % (ref 36.0–46.0)
HEMOGLOBIN: 12.5 g/dL (ref 12.0–15.0)
MCH: 27.1 pg (ref 26.0–34.0)
MCHC: 34.2 g/dL (ref 30.0–36.0)
MCV: 79.2 fL (ref 78.0–100.0)
PLATELETS: 187 10*3/uL (ref 150–400)
RBC: 4.62 MIL/uL (ref 3.87–5.11)
RDW: 14.4 % (ref 11.5–15.5)
WBC: 5.2 10*3/uL (ref 4.0–10.5)

## 2017-05-16 LAB — TYPE AND SCREEN
ABO/RH(D): B POS
Antibody Screen: NEGATIVE

## 2017-05-16 LAB — POCT FERN TEST: POCT Fern Test: POSITIVE

## 2017-05-16 MED ORDER — OXYCODONE-ACETAMINOPHEN 5-325 MG PO TABS: 1.0000 | ORAL_TABLET | ORAL | Status: DC | PRN

## 2017-05-16 MED ORDER — ONDANSETRON HCL 4 MG/2ML IJ SOLN
4.0000 mg | Freq: Four times a day (QID) | INTRAMUSCULAR | Status: DC | PRN
  Administered 2017-05-16: 4 mg via INTRAVENOUS
  Filled 2017-05-16: qty 2

## 2017-05-16 MED ORDER — OXYTOCIN BOLUS FROM INFUSION
500.0000 mL | Freq: Once | INTRAVENOUS | Status: AC
  Administered 2017-05-16: 500 mL via INTRAVENOUS

## 2017-05-16 MED ORDER — PHENYLEPHRINE 40 MCG/ML (10ML) SYRINGE FOR IV PUSH (FOR BLOOD PRESSURE SUPPORT): 80.0000 ug | PREFILLED_SYRINGE | INTRAVENOUS | Status: DC | PRN

## 2017-05-16 MED ORDER — FENTANYL CITRATE (PF) 100 MCG/2ML IJ SOLN
100.0000 ug | INTRAMUSCULAR | Status: DC | PRN
  Administered 2017-05-16: 100 ug via INTRAVENOUS
  Filled 2017-05-16: qty 2

## 2017-05-16 MED ORDER — PHENYLEPHRINE 40 MCG/ML (10ML) SYRINGE FOR IV PUSH (FOR BLOOD PRESSURE SUPPORT)
80.0000 ug | PREFILLED_SYRINGE | INTRAVENOUS | Status: DC | PRN
  Filled 2017-05-16: qty 5

## 2017-05-16 MED ORDER — FENTANYL 2.5 MCG/ML BUPIVACAINE 1/10 % EPIDURAL INFUSION (WH - ANES)
14.0000 mL/h | INTRAMUSCULAR | Status: DC | PRN
Start: 2017-05-16 — End: 2017-05-17
  Administered 2017-05-16: 14 mL/h via EPIDURAL
  Filled 2017-05-16: qty 100

## 2017-05-16 MED ORDER — LACTATED RINGERS IV SOLN
500.0000 mL | Freq: Once | INTRAVENOUS | Status: AC
  Administered 2017-05-16: 500 mL via INTRAVENOUS

## 2017-05-16 MED ORDER — LACTATED RINGERS IV SOLN
INTRAVENOUS | Status: DC
  Administered 2017-05-16: 18:00:00 via INTRAVENOUS

## 2017-05-16 MED ORDER — OXYCODONE-ACETAMINOPHEN 5-325 MG PO TABS
2.0000 | ORAL_TABLET | ORAL | Status: DC | PRN
Start: 2017-05-16 — End: 2017-05-17

## 2017-05-16 MED ORDER — LACTATED RINGERS IV SOLN: 500.0000 mL | Freq: Once | INTRAVENOUS | Status: DC

## 2017-05-16 MED ORDER — ACETAMINOPHEN 325 MG PO TABS: 650.0000 mg | ORAL_TABLET | ORAL | Status: DC | PRN

## 2017-05-16 MED ORDER — SOD CITRATE-CITRIC ACID 500-334 MG/5ML PO SOLN: 30.0000 mL | ORAL | Status: DC | PRN

## 2017-05-16 MED ORDER — EPHEDRINE 5 MG/ML INJ
10.0000 mg | INTRAVENOUS | Status: DC | PRN
  Filled 2017-05-16: qty 2

## 2017-05-16 MED ORDER — TERBUTALINE SULFATE 1 MG/ML IJ SOLN
0.2500 mg | Freq: Once | INTRAMUSCULAR | Status: DC | PRN
  Filled 2017-05-16: qty 1

## 2017-05-16 MED ORDER — LACTATED RINGERS IV SOLN: 500.0000 mL | INTRAVENOUS | Status: DC | PRN

## 2017-05-16 MED ORDER — PHENYLEPHRINE 40 MCG/ML (10ML) SYRINGE FOR IV PUSH (FOR BLOOD PRESSURE SUPPORT)
80.0000 ug | PREFILLED_SYRINGE | INTRAVENOUS | Status: DC | PRN
  Filled 2017-05-16: qty 5
  Filled 2017-05-16: qty 10

## 2017-05-16 MED ORDER — EPHEDRINE 5 MG/ML INJ: 10.0000 mg | INTRAVENOUS | Status: DC | PRN

## 2017-05-16 MED ORDER — DIPHENHYDRAMINE HCL 50 MG/ML IJ SOLN: 12.5000 mg | INTRAMUSCULAR | Status: DC | PRN

## 2017-05-16 MED ORDER — OXYTOCIN 40 UNITS IN LACTATED RINGERS INFUSION - SIMPLE MED
2.5000 [IU]/h | INTRAVENOUS | Status: DC
  Filled 2017-05-16: qty 1000

## 2017-05-16 MED ORDER — EPHEDRINE 5 MG/ML INJ
10.0000 mg | INTRAVENOUS | Status: DC | PRN
Start: 2017-05-16 — End: 2017-05-17
  Filled 2017-05-16: qty 2

## 2017-05-16 MED ORDER — LIDOCAINE HCL (PF) 1 % IJ SOLN
30.0000 mL | INTRAMUSCULAR | Status: DC | PRN
  Filled 2017-05-16: qty 30

## 2017-05-16 MED ORDER — MISOPROSTOL 200 MCG PO TABS
50.0000 ug | ORAL_TABLET | ORAL | Status: DC | PRN
  Administered 2017-05-16: 50 ug via ORAL
  Filled 2017-05-16: qty 1

## 2017-05-16 NOTE — MAU Note (Signed)
Started leaking at 1430, has wet through dress again.clear fluid still. No bleeding. Having some pain. Was 1, 2 wks ago

## 2017-05-16 NOTE — Anesthesia Preprocedure Evaluation (Addendum)
Anesthesia Evaluation  Patient identified by MRN, date of birth, ID band Patient awake    Reviewed: Allergy & Precautions, NPO status , Patient's Chart, lab work & pertinent test results  History of Anesthesia Complications Negative for: history of anesthetic complications  Airway Mallampati: II  TM Distance: >3 FB Neck ROM: Full    Dental  (+) Dental Advisory Given   Pulmonary neg pulmonary ROS,    breath sounds clear to auscultation       Cardiovascular negative cardio ROS   Rhythm:Regular Rate:Normal     Neuro/Psych negative neurological ROS     GI/Hepatic Neg liver ROS, GERD  ,  Endo/Other  negative endocrine ROS  Renal/GU      Musculoskeletal   Abdominal   Peds  Hematology  (+) Sickle cell trait ,   Anesthesia Other Findings   Reproductive/Obstetrics (+) Pregnancy                            Anesthesia Physical Anesthesia Plan  ASA: II  Anesthesia Plan: Epidural   Post-op Pain Management:    Induction:   PONV Risk Score and Plan: Treatment may vary due to age or medical condition  Airway Management Planned: Natural Airway  Additional Equipment:   Intra-op Plan:   Post-operative Plan:   Informed Consent: I have reviewed the patients History and Physical, chart, labs and discussed the procedure including the risks, benefits and alternatives for the proposed anesthesia with the patient or authorized representative who has indicated his/her understanding and acceptance.   Dental advisory given  Plan Discussed with:   Anesthesia Plan Comments: (Patient identified. Risks/Benefits/Options discussed with patient including but not limited to bleeding, infection, nerve damage, paralysis, failed block, incomplete pain control, headache, blood pressure changes, nausea, vomiting, reactions to medication both or allergic, itching and postpartum back pain. Confirmed with bedside  nurse the patient's most recent platelet count. Confirmed with patient that they are not currently taking any anticoagulation, have any bleeding history or any family history of bleeding disorders. Patient expressed understanding and wished to proceed. All questions were answered. )       Anesthesia Quick Evaluation

## 2017-05-16 NOTE — Progress Notes (Signed)
RN spoke with [name], MD about plan of care; orders that pt may have regular diet and no cervical exam at this time when give cytotec.

## 2017-05-16 NOTE — H&P (Signed)
LABOR ADMISSION HISTORY AND PHYSICAL  Katie Mayer is a 26 y.o. female G2P1001 with IUP at [redacted]w[redacted]d by LMP presenting for SROM @ 1400. She reports +FMs, No LOF, no VB, no blurry vision, no headaches, no peripheral edema, and no RUQ pain.  She plans on breast feeding. She requests Nexplanon for birth control.  Dating: By LMP --->  Estimated Date of Delivery: 05/31/17  Sono:   , CWD, normal anatomy, cephalic presentation, 464g, 54% EFW  Patient initiated prenatal care at 14 weeks at [facility]  Prenatal History/Complications: Sickle-cell trait  Past Medical History: Past Medical History:  Diagnosis Date  . Anemia   . 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 HPV (human papilloma virus) anogenital infection     Past Surgical History: Past Surgical History:  Procedure Laterality Date  . NO PAST SURGERIES      Obstetrical History: OB History    Gravida Para Term Preterm AB Living   SAB TAB Ectopic Multiple Live Births         0 1      Social History: Social History   Social History  . Marital status: Married    Spouse name: N/A  . Number of children: N/A  . Years of education: N/A   Social History Main Topics  . Smoking status: Never Smoker  . Smokeless tobacco: Never Used  . Alcohol use No  . Drug use: No  . Sexual activity: Yes   Other Topics Concern  . None   Social History Narrative  . None    Family History: Family History  Problem Relation Age of Onset  . Hypertension Mother   . Hypertension Father     Allergies: No Known Allergies  Prescriptions Prior to Admission  Medication Sig Dispense Refill Last Dose  . Prenatal Vit-Fe Fumarate-FA (MULTIVITAMIN-PRENATAL) 27-0.8 MG TABS tablet Take 1 tablet by mouth daily at 12 noon. 30 each 3 05/15/2017 at Unknown time  . terconazole (TERAZOL 7) 0.4 % vaginal cream Place 1 applicator vaginally at bedtime.  0 Past Week at Unknown time  . guaiFENesin (MUCINEX) 600 MG 12 hr tablet Take 1 tablet  (600 mg total) by mouth 2 (two) times daily. (Patient not taking: Reported on 05/13/2017) 60 tablet 1 Not Taking at Unknown time     Review of Systems   All systems reviewed and negative except as stated in HPI  Blood pressure 120/68, pulse 85, temperature 98.2 F (36.8 C), temperature source Oral, resp. rate 16, weight 84.4 kg (186 lb), last menstrual period 08/24/2016, SpO2 99 %, unknown if currently breastfeeding. General appearance: alert, cooperative and appears stated age Lungs: normal work of breathing Extremities: Homans sign is negative, no sign of DVT Presentation: cephalic Fetal monitoringBaseline: 135 bpm, Variability: Good {> 6 bpm), Accelerations: Reactive and Decelerations: Absent Uterine activity infrequent Dilation: 1 Effacement (%): 50 Station: -2 Exam by:: K.Wilson,RN   Prenatal labs: ABO, Rh: B/Positive/-- (04/19 0932) Antibody: Negative (04/19 0932) Rubella: Immune RPR: Non Reactive (07/31 1150)  HBsAg: Negative (04/19 0932)  HIV:   Non Reactive GBS: Negative (09/18 0000)  GTT: Fasting- 63, 1 hr- 126, 2 hr- 88  Prenatal Transfer Tool  Maternal Diabetes: No Genetic Screening: Normal Maternal Ultrasounds/Referrals: Normal Fetal Ultrasounds or other Referrals:  None Maternal Substance Abuse:  No Significant Maternal Medications:  None Significant Maternal Lab Results: None  Results for orders placed or performed during the hospital encounter of 05/16/17 (from the past  24 hour(s))  Fern Test   Collection Time: 05/16/17  5:12 PM  Result Value Ref Range   POCT Fern Test Positive = ruptured amniotic membanes     Patient Active Problem List   Diagnosis Date Noted  . Supervision of other normal pregnancy, antepartum 12/05/2016  . Hemoglobin S trait (HCC) 12/05/2016    Assessment: Katie Mayer is a 26 y.o. G2P1001 at [redacted]w[redacted]d here for SROM.  #Labor: augment with cytotec 50 mcg oral then pitocin once cervix ripens further #Pain: Epidural upon  request #FWB: Cat 1 #ID: GBS neg #MOF: breast #MOC: Nexplanon #Circ:  Yes, inpatient  [name], MD Family Medicine Resident PGY-1  05/16/2017, 5:22 PM  CNM attestation:  I have seen and examined this patient; I agree with above documentation in the resident's note.   Katie Mayer is a 26 y.o. G2P1001 here for SROM @ 1400  PE: BP 124/83   Pulse 91   Temp 98.3 F (36.8 C) (Oral)   Resp 18   Ht  (1.727 m)   Wt 84.4 kg (186 lb)   LMP 08/24/2016   SpO2 99%   BMI 28.28 kg/m  Gen: calm comfortable, NAD Resp: normal effort, no distress Abd: gravid  ROS, labs, PMH reviewed  Plan: Admit to [facility] plan on cytotec for cx ripening as no labor ctx yet Will progress to cervical foley/Pit prn Anticipate SVD  [name], [name] CNM 05/17/2017, 12:08 AM

## 2017-05-16 NOTE — MAU Note (Signed)
Urine in lab 

## 2017-05-17 ENCOUNTER — Encounter (HOSPITAL_COMMUNITY): Payer: Self-pay

## 2017-05-17 LAB — RPR: RPR Ser Ql: NONREACTIVE

## 2017-05-17 MED ORDER — SIMETHICONE 80 MG PO CHEW: 80.0000 mg | CHEWABLE_TABLET | ORAL | Status: DC | PRN

## 2017-05-17 MED ORDER — IBUPROFEN 600 MG PO TABS
600.0000 mg | ORAL_TABLET | Freq: Four times a day (QID) | ORAL | Status: DC
  Administered 2017-05-17 – 2017-05-18 (×7): 600 mg via ORAL
  Filled 2017-05-17 (×7): qty 1

## 2017-05-17 MED ORDER — ONDANSETRON HCL 4 MG/2ML IJ SOLN: 4.0000 mg | INTRAMUSCULAR | Status: DC | PRN

## 2017-05-17 MED ORDER — COCONUT OIL OIL: 1.0000 "application " | TOPICAL_OIL | Status: DC | PRN

## 2017-05-17 MED ORDER — PRENATAL MULTIVITAMIN CH
1.0000 | ORAL_TABLET | Freq: Every day | ORAL | Status: DC
  Administered 2017-05-17 – 2017-05-18 (×2): 1 via ORAL
  Filled 2017-05-17 (×2): qty 1

## 2017-05-17 MED ORDER — WITCH HAZEL-GLYCERIN EX PADS: 1.0000 "application " | MEDICATED_PAD | CUTANEOUS | Status: DC | PRN

## 2017-05-17 MED ORDER — OXYCODONE HCL 5 MG PO TABS: 5.0000 mg | ORAL_TABLET | ORAL | Status: DC | PRN

## 2017-05-17 MED ORDER — ONDANSETRON HCL 4 MG PO TABS: 4.0000 mg | ORAL_TABLET | ORAL | Status: DC | PRN

## 2017-05-17 MED ORDER — DIPHENHYDRAMINE HCL 25 MG PO CAPS: 25.0000 mg | ORAL_CAPSULE | Freq: Four times a day (QID) | ORAL | Status: DC | PRN

## 2017-05-17 MED ORDER — LIDOCAINE HCL (PF) 1 % IJ SOLN
INTRAMUSCULAR | Status: DC | PRN
  Administered 2017-05-16 (×2): 6 mL via EPIDURAL

## 2017-05-17 MED ORDER — TETANUS-DIPHTH-ACELL PERTUSSIS 5-2.5-18.5 LF-MCG/0.5 IM SUSP: 0.5000 mL | Freq: Once | INTRAMUSCULAR | Status: DC

## 2017-05-17 MED ORDER — BENZOCAINE-MENTHOL 20-0.5 % EX AERO: 1.0000 "application " | INHALATION_SPRAY | CUTANEOUS | Status: DC | PRN

## 2017-05-17 MED ORDER — ZOLPIDEM TARTRATE 5 MG PO TABS: 5.0000 mg | ORAL_TABLET | Freq: Every evening | ORAL | Status: DC | PRN

## 2017-05-17 MED ORDER — DIBUCAINE 1 % RE OINT: 1.0000 "application " | TOPICAL_OINTMENT | RECTAL | Status: DC | PRN

## 2017-05-17 MED ORDER — ACETAMINOPHEN 325 MG PO TABS: 650.0000 mg | ORAL_TABLET | ORAL | Status: DC | PRN

## 2017-05-17 MED ORDER — SENNOSIDES-DOCUSATE SODIUM 8.6-50 MG PO TABS
2.0000 | ORAL_TABLET | ORAL | Status: DC
  Administered 2017-05-18: 2 via ORAL
  Filled 2017-05-17 (×2): qty 2

## 2017-05-17 NOTE — Lactation Note (Signed)
This note was copied from a baby's chart. Lactation Consultation Note  Patient Name: Katie Mayer [id number] Date: 05/17/2017 Reason for consult: Initial assessment;Early term 42-38.6wks  Visited with Mom (P2, [redacted]w[redacted]d), baby 48 hrs old.  Baby just had a bath, and was sleeping STS on Mom.  Identified feeding cues for Mom.  Offered to assist with positioning and latch.   Demonstrated hand expression and colostrum easily expressed. Baby latched easily in football hold on left breast.  Mom needing much guidance on use of breast support, and supporting baby a adequate height.  Baby observed swallowing multiple times.  Taught Mom how to use alternate breast compression to help baby transfer more milk.   Encouraged continued STS and feeding often on cue, goal of 8-12 feedings per 24 hrs.   Mom to ask for help prn.  Mom stated that she breastfed her 1st baby (69 months old), for 4 months, and due to weight gain being minimal, she had to supplement with formula.  Baby then wouldn't breastfeed from Mom.  Mom really wants to breastfeed longer.  Mom aware of OP lactation services available to her.  Lactation brochure left with Mom. Mom reports positive breast changes in her pregnancy.  Consult Status Consult Status: Follow-up Date: 05/18/17 Follow-up type: In-patient    Judee Clara 05/17/2017, 3:21 PM

## 2017-05-17 NOTE — Anesthesia Procedure Notes (Signed)
Epidural Patient location during procedure: OB Start time: 05/16/2017 10:50 PM End time: 05/16/2017 11:16 PM  Staffing Anesthesiologist: Jairo Ben Performed: anesthesiologist   Preanesthetic Checklist Completed: patient identified, surgical consent, pre-op evaluation, timeout performed, IV checked, risks and benefits discussed and monitors and equipment checked  Epidural Patient position: sitting Prep: site prepped and draped and DuraPrep Patient monitoring: blood pressure, continuous pulse ox, cardiac monitor and heart rate Approach: midline Location: L3-L4 Injection technique: LOR air  Needle:  Needle type: Tuohy  Needle gauge: 17 G Needle length: 9 cm Needle insertion depth: 5.5 cm Catheter type: closed end flexible Catheter size: 19 Gauge Catheter at skin depth: 11 cm Test dose: negative (1% lidocaine)  Assessment Events: blood not aspirated, injection not painful, no injection resistance, negative IV test and no paresthesia  Additional Notes Pt identified in Labor room.  Monitors applied. Working IV access confirmed. Sterile prep, drape lumbar spine.  1% lido local L 3,4.  #17ga Touhy LOR air at 5.5 cm L 3,4, cath in easily to 11 cm skin. Test dose OK, cath dosed and infusion begun.  Patient asymptomatic, VSS, no heme aspirated, tolerated well.  Sandford Craze, MDReason for block:procedure for pain

## 2017-05-17 NOTE — Progress Notes (Signed)
PPD # 1 SVD Information for the patient's newborn:  Azlee, Monforte [[id number]]  female    breast feeding    S:  Reports feeling well.             Tolerating po/ No nausea or vomiting             Bleeding is moderate             Pain controlled with ibuprofen              Up ad lib / ambulatory / voiding without difficulties        O:  A & O x 3, in no apparent distress              VS:  Vitals:   05/17/17 0100 05/17/17 0122 05/17/17 0233 05/17/17 0627  BP: 106/72 106/68 109/62 106/69  Pulse: 85 77 82 65  Resp:  Temp:  98.1 F (36.7 C) 98.6 F (37 C) 97.9 F (36.6 C)  TempSrc:  Oral Oral Oral  SpO2:      Weight:      Height:        LABS:   Recent Labs  05/16/17 1740  WBC 5.2  HGB 12.5  HCT 36.6  PLT 187    Blood type: --/--/B POS (09/28 1740)  Rubella: 13.70 (04/19 0932)   I&O: I/O last 3 completed shifts: In: -  Out: 100 [Blood:100]          No intake/output data recorded.  Lungs: Clear and unlabored  Heart: regular rate and rhythm / no murmurs  Abdomen: soft, non-tender, non-distended             Fundus: firm, non-tender, U-2  Perineum: intact  Lochia: scant  Extremities: no edema, no calf pain or tenderness    A/P: PPD # 1 26 y.o., [id number]   Active Problems:   Supervision of other normal pregnancy, antepartum   Postpartum care following vaginal delivery   Doing well - stable status  Routine post partum orders  Undecided between Nexplanon or IUD  Anticipate discharge tomorrow    Apolonio Schneiders, BSN, SNM 05/17/2017, 8:08 AM

## 2017-05-17 NOTE — Anesthesia Postprocedure Evaluation (Signed)
Anesthesia Post Note  Patient: Katie Mayer  Procedure(s) Performed: * No procedures listed *     Patient location during evaluation: Mother Baby Anesthesia Type: Epidural Level of consciousness: awake and alert and oriented Pain management: satisfactory to patient Vital Signs Assessment: post-procedure vital signs reviewed and stable Respiratory status: respiratory function stable Cardiovascular status: stable Postop Assessment: no headache, no backache, epidural receding, patient able to bend at knees, no signs of nausea or vomiting and adequate PO intake Anesthetic complications: no    Last Vitals:  Vitals:   05/17/17 0233 05/17/17 0627  BP: 109/62 106/69  Pulse: 82 65  Resp: 16 14  Temp: 37 C 36.6 C  SpO2:      Last Pain:  Vitals:   05/17/17 0920  TempSrc:   PainSc: 0-No pain   Pain Goal:                 Josias Tomerlin

## 2017-05-18 MED ORDER — IBUPROFEN 600 MG PO TABS: 600.0000 mg | ORAL_TABLET | Freq: Four times a day (QID) | ORAL | 0 refills | Status: DC

## 2017-05-18 NOTE — Discharge Instructions (Signed)

## 2017-05-18 NOTE — Lactation Note (Signed)
This note was copied from a baby's chart. Lactation Consultation Note  Patient Name: Katie Mayer [id number] Date: 05/18/2017 Reason for consult: Follow-up assessment;Early term 37-38.6wks;Infant < 6lbs  Baby 34 hours old. Mom nursing baby when this LC entered the room. Baby latched deeply to right breast in cross-cradle position with lips flanged and some swallows noted. Mom reports that her breasts are starting to feel heavier, and her breasts are leaking--given additional breast pads. Mom states that she has been hearing swallows while baby at breast, but baby sometimes is sleepy at breast. Mom reports that she has been nursing baby about every 4 hours. Enc mom to offer baby the breast with cues, and to offer STS and nuzzling at the breast by 3 hours d/t baby being less than 6 pounds and early GA. Baby still cueing after nursing for 25 minutes, so assisted mom with hand expression and syringe feeding baby 2.5 ml of EBM. Mom's left breast easily expressible, right breast only a few drops--baby was nursing at right breast. Mom able to easily express another 2.5 ml, so enc mom to continue hand expression and give baby an additional 5 ml of EBM. Mom given supplementation guidelines with review. Reviewed EBM guidelines as well. Mom aware of OP/BFSG and LC phone line assistance as needed.  Maternal Data    Feeding Feeding Type: Breast Fed Length of feed: 25 min  LATCH Score Latch: Grasps breast easily, tongue down, lips flanged, rhythmical sucking.  Audible Swallowing: A few with stimulation  Type of Nipple: Everted at rest and after stimulation  Comfort (Breast/Nipple): Soft / non-tender  Hold (Positioning): No assistance needed to correctly position infant at breast.  LATCH Score: 9  Interventions Interventions: Hand express;Expressed milk  Lactation Tools Discussed/Used     Consult Status Consult Status: PRN    Sherlyn Hay 05/18/2017, 10:34 AM

## 2017-05-18 NOTE — Discharge Summary (Signed)
**Note Katie-Identified via Obfuscation** OB Discharge Summary     Patient Name: Katie Mayer DOB: [birth year] MRN: [id number]  Date of admission: 05/16/2017 Delivering MD: Lauro Franklin   Date of discharge: 05/18/2017  Admitting diagnosis: 37WKS WATER BROKE Intrauterine pregnancy: [redacted]w[redacted]d     Secondary diagnosis:  Active Problems:   Supervision of other normal pregnancy, antepartum   Postpartum care following vaginal delivery  Additional problems: none     Discharge diagnosis: Term Pregnancy Delivered                                                                                                Post partum procedures:none  Augmentation: none  Complications: None  Hospital course:  Onset of Labor With Vaginal Delivery     26 y.o. yo Katie Mayer at [redacted]w[redacted]d was admitted in Active Labor on 05/16/2017. Patient had an uncomplicated labor course as follows:  Membrane Rupture Time/Date: 2:30 PM ,05/16/2017   Intrapartum Procedures: Episiotomy: None [1]                                         Lacerations:  None [1]  Patient had a delivery of a Viable infant. 05/16/2017  Information for the patient's newborn:  Katie Mayer [[id number]]  Delivery Method: Vaginal, Spontaneous Delivery (Filed from Delivery Summary)    Pateint had an uncomplicated postpartum course.  She is ambulating, tolerating a regular diet, passing flatus, and urinating well. Patient is discharged home in stable condition on 05/18/17.   Physical exam  Vitals:   05/17/17 0627 05/17/17 1655 05/17/17 1810 05/18/17 0620  BP: 106/69 98/60 115/72 101/64  Pulse: 65 87 72 76  Resp: Temp: 97.9 F (36.6 C) 98.6 F (37 C) 97.8 F (36.6 C) 98.5 F (36.9 C)  TempSrc: Oral Oral Oral Oral  SpO2:  99%    Weight:      Height:       General: alert, cooperative and no distress Lochia: appropriate Uterine Fundus: firm Incision: Healing well with no significant drainage DVT Evaluation: No evidence of DVT seen on physical  exam. Labs: Lab Results  Component Value Date   WBC 5.2 05/16/2017   HGB 12.5 05/16/2017   HCT 36.6 05/16/2017   MCV 79.2 05/16/2017   PLT 187 05/16/2017   No flowsheet data found.  Discharge instruction: per After Visit Summary and "Baby and Me Booklet".  After visit meds:  Allergies as of 05/18/2017   No Known Allergies     Medication List    TAKE these medications   guaiFENesin 600 MG 12 hr tablet Commonly known as:  MUCINEX Take 1 tablet (600 mg total) by mouth 2 (two) times daily.   ibuprofen 600 MG tablet Commonly known as:  ADVIL,MOTRIN Take 1 tablet (600 mg total) by mouth every 6 (six) hours.   multivitamin-prenatal 27-0.8 MG Tabs tablet Take 1 tablet by mouth daily at 12 noon.   terconazole 0.4 % vaginal cream Commonly known as:  TERAZOL 7 Place 1  applicator vaginally at bedtime.            Discharge Care Instructions        Start     Ordered   05/18/17 0000  ibuprofen (ADVIL,MOTRIN) 600 MG tablet  Every 6 hours    Question:  Supervising Provider  Answer:  Hermina Staggers   05/18/17 0655   05/18/17 0000  Discharge patient    Question Answer Comment  Discharge disposition 01-Home or Self Care   Discharge patient date 05/18/2017      05/18/17 0655   05/16/17 0000  OB RESULT CONSOLE Group B Strep    Comments:  This external order was created through the Results Console.   05/16/17 1651      Diet: routine diet  Activity: Advance as tolerated. Pelvic rest for 6 weeks.   Outpatient follow up:4 weeks Follow up Appt:No future appointments. Follow up Visit:No Follow-up on file.  Postpartum contraception: IUD Mirena  Newborn Data: Live born female  Birth Weight: 6 lb 2.6 oz (2795 g) APGAR: 9, 9  Baby Feeding: Breast Disposition:home with mother   05/18/2017 Katie Mayer, CNM

## 2017-05-20 ENCOUNTER — Encounter: Payer: BLUE CROSS/BLUE SHIELD | Admitting: Obstetrics and Gynecology

## 2017-05-27 ENCOUNTER — Encounter: Payer: BLUE CROSS/BLUE SHIELD | Admitting: Medical

## 2017-06-03 ENCOUNTER — Encounter: Payer: BLUE CROSS/BLUE SHIELD | Admitting: Advanced Practice Midwife

## 2017-06-16 ENCOUNTER — Ambulatory Visit (INDEPENDENT_AMBULATORY_CARE_PROVIDER_SITE_OTHER): Payer: BLUE CROSS/BLUE SHIELD | Admitting: Advanced Practice Midwife

## 2017-06-16 ENCOUNTER — Encounter: Payer: Self-pay | Admitting: Advanced Practice Midwife

## 2017-06-16 DIAGNOSIS — Z30011 Encounter for initial prescription of contraceptive pills: Secondary | ICD-10-CM | POA: Insufficient documentation

## 2017-06-16 MED ORDER — NORETHINDRONE 0.35 MG PO TABS: 1.0000 | ORAL_TABLET | Freq: Every day | ORAL | 11 refills | Status: DC

## 2017-06-16 NOTE — Patient Instructions (Signed)
Oral Contraception Use Oral contraceptive pills (OCPs) are medicines taken to prevent pregnancy. OCPs work by preventing the ovaries from releasing eggs. The hormones in OCPs also cause the cervical mucus to thicken, preventing the sperm from entering the uterus. The hormones also cause the uterine lining to become thin, not allowing a fertilized egg to attach to the inside of the uterus. OCPs are highly effective when taken exactly as prescribed. However, OCPs do not prevent sexually transmitted diseases (STDs). Safe sex practices, such as using condoms along with an OCP, can help prevent STDs. Before taking OCPs, you may have a physical exam and Pap test. Your health care provider may also order blood tests if necessary. Your health care provider will make sure you are a good candidate for oral contraception. Discuss with your health care provider the possible side effects of the OCP you may be prescribed. When starting an OCP, it can take 2 to 3 months for the body to adjust to the changes in hormone levels in your body. How to take oral contraceptive pills Your health care provider may advise you on how to start taking the first cycle of OCPs. Otherwise, you can:  Start on day 1 of your menstrual period. You will not need any backup contraceptive protection with this start time.  Start on the first Sunday after your menstrual period or the day you get your prescription. In these cases, you will need to use backup contraceptive protection for the first week.  Start the pill at any time of your cycle. If you take the pill within 5 days of the start of your period, you are protected against pregnancy right away. In this case, you will not need a backup form of birth control. If you start at any other time of your menstrual cycle, you will need to use another form of birth control for 7 days. If your OCP is the type called a minipill, it will protect you from pregnancy after taking it for 2 days (48  hours).  After you have started taking OCPs:  If you forget to take 1 pill, take it as soon as you remember. Take the next pill at the regular time.  If you miss 2 or more pills, call your health care provider because different pills have different instructions for missed doses. Use backup birth control until your next menstrual period starts.  If you use a 28-day pack that contains inactive pills and you miss 1 of the last 7 pills (pills with no hormones), it will not matter. Throw away the rest of the non-hormone pills and start a new pill pack.  No matter which day you start the OCP, you will always start a new pack on that same day of the week. Have an extra pack of OCPs and a backup contraceptive method available in case you miss some pills or lose your OCP pack. Follow these instructions at home:  Do not smoke.  Always use a condom to protect against STDs. OCPs do not protect against STDs.  Use a calendar to mark your menstrual period days.  Read the information and directions that came with your OCP. Talk to your health care provider if you have questions. Contact a health care provider if:  You develop nausea and vomiting.  You have abnormal vaginal discharge or bleeding.  You develop a rash.  You miss your menstrual period.  You are losing your hair.  You need treatment for mood swings or depression.  You   get dizzy when taking the OCP.  You develop acne from taking the OCP.  You become pregnant. Get help right away if:  You develop chest pain.  You develop shortness of breath.  You have an uncontrolled or severe headache.  You develop numbness or slurred speech.  You develop visual problems.  You develop pain, redness, and swelling in the legs. This information is not intended to replace advice given to you by your health care provider. Make sure you discuss any questions you have with your health care provider. Document Released: 07/25/2011 Document  Revised: 01/11/2016 Document Reviewed: 01/24/2013 Elsevier Interactive Patient Education  2017 Elsevier Inc.  

## 2017-06-16 NOTE — Progress Notes (Signed)
Subjective:     Katie Mayer is a 26 y.o. female who presents for a postpartum visit. She is 4 day postpartum following a spontaneous vaginal delivery. I have fully reviewed the prenatal and intrapartum course. The delivery was at 6534w6d gestational weeks. Outcome: spontaneous vaginal delivery. Anesthesia: epidural. Postpartum course has been unremarkable. Baby's course has been unremarkable. Baby is feeding by breast. Bleeding no bleeding. Bowel function is normal. Bladder function is normal. Patient is not sexually active. Contraception method is none. Postpartum depression screening: negative.  The following portions of the patient's history were reviewed and updated as appropriate: allergies, current medications, past family history, past medical history, past social history, past surgical history and problem list.  Review of Systems Pertinent items are noted in HPI.   Objective:    There were no vitals taken for this visit.  General:  alert, cooperative and no distress   Breasts:  inspection negative, no nipple discharge or bleeding, no masses or nodularity palpable  Lungs: clear to auscultation bilaterally  Heart:  regular rate and rhythm  Abdomen: soft, non-tender; bowel sounds normal; no masses,  no organomegaly   Vulva:  not evaluated  Vagina: not evaluated  Cervix:  n/a  Corpus: not examined  Adnexa:  not evaluated  Rectal Exam: Not performed.        Assessment:     Normal  postpartum exam. Pap smear not done at today's visit.  States had it done last year.  Declines pap today.  Plan:    1. Contraception: oral progesterone-only contraceptive 2. Discussed methods of contraception. Still breastfeeding. Discussed combination OCPs vs minipills.  Will use Minipill until weaning.  I told her she could just call in when that time came and we would call in her Rx 3. Follow up in: 1 month or as needed.

## 2017-11-11 ENCOUNTER — Telehealth: Payer: Self-pay | Admitting: General Practice

## 2017-11-11 NOTE — Telephone Encounter (Signed)
Patient called and left message on nurse voicemail line that was difficult to understand. It seems like the patient was calling and requesting a 90 day supply of her OCPs at a time and wants the request sent to [facility] on [address]. Called patient for clarification, no answer- left message on voicemail stating we are trying to reach you to return your phone call and for clarification, please call us back or reach out to us via mychart. Will send mychart message.

## 2017-11-12 ENCOUNTER — Other Ambulatory Visit: Payer: Self-pay | Admitting: General Practice

## 2017-11-12 DIAGNOSIS — Z3041 Encounter for surveillance of contraceptive pills: Secondary | ICD-10-CM

## 2017-11-12 MED ORDER — NORETHINDRONE 0.35 MG PO TABS: 1.0000 | ORAL_TABLET | Freq: Every day | ORAL | 2 refills | Status: DC

## 2017-12-14 ENCOUNTER — Encounter: Payer: Self-pay | Admitting: Family Medicine

## 2018-08-07 IMAGING — US US MFM OB DETAIL+14 WK
1 series · 14 of 28 positions shown · non-contrast
Comparison: none

[Series 1: us mfm ob detail+14 wk · 14 of 118 slices shown]
[im 5/118]
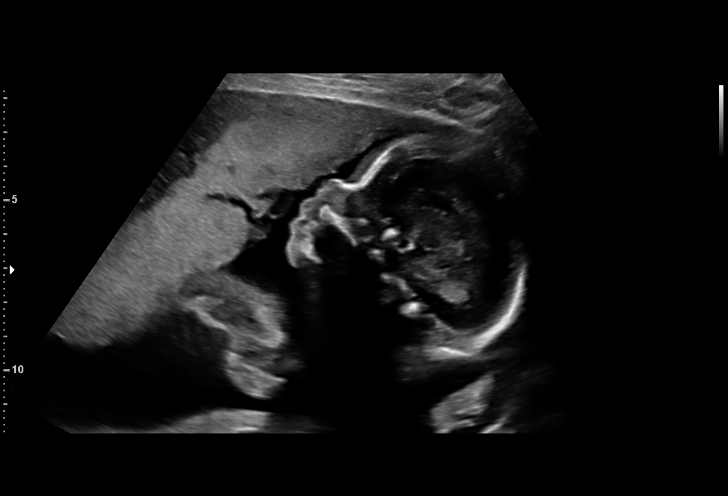
[im 14/118]
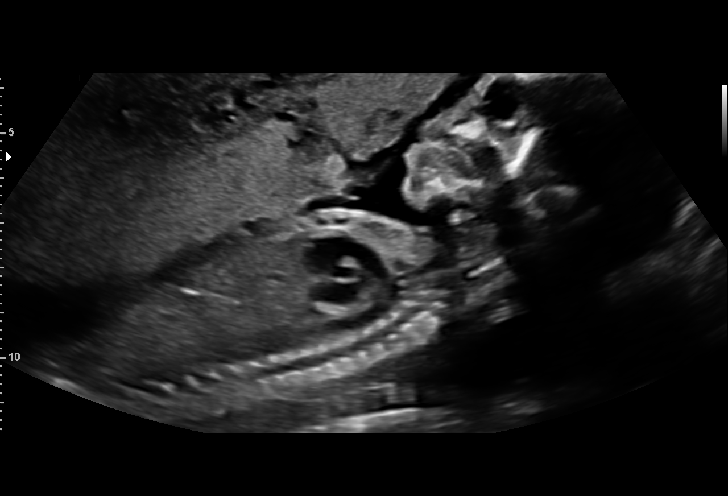
[im 22/118]
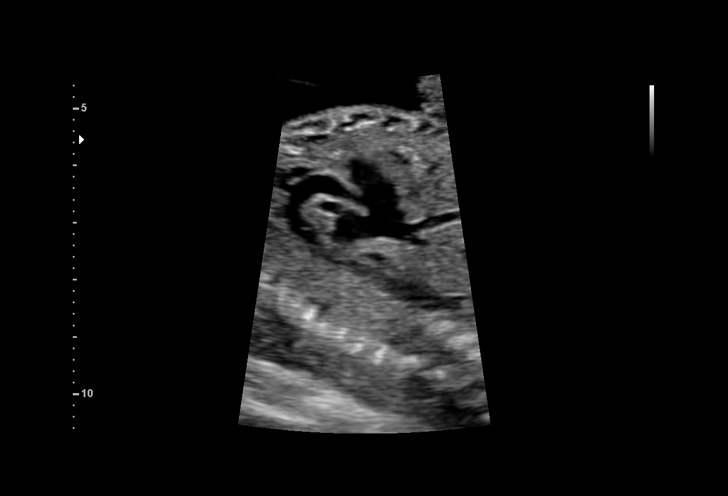
[im 31/118]
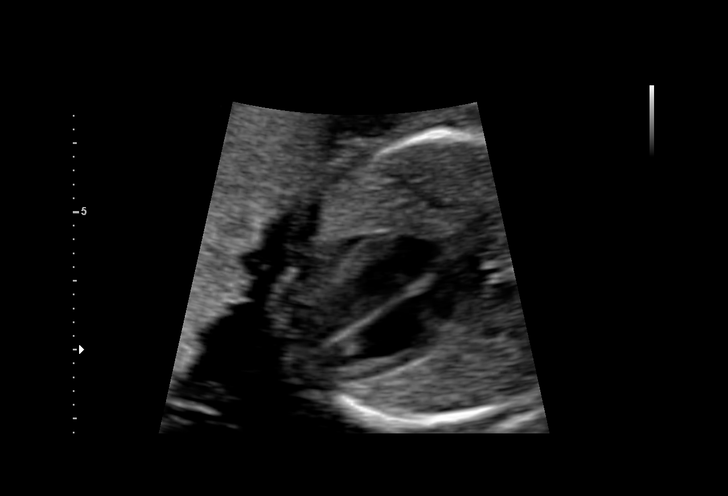
[im 40/118]
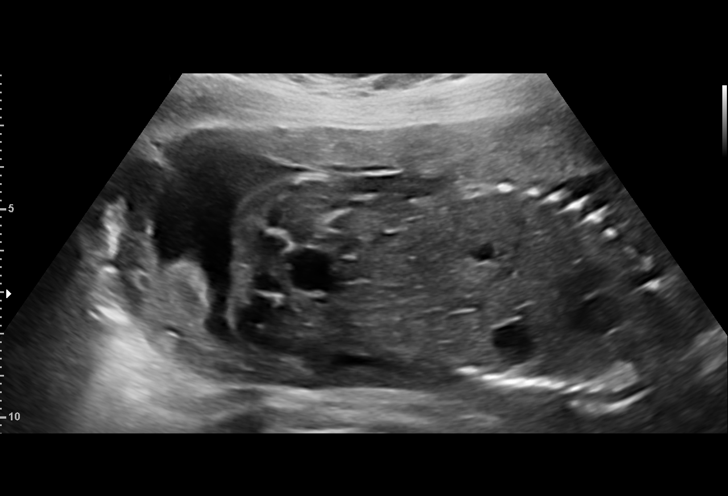
[im 48/118]
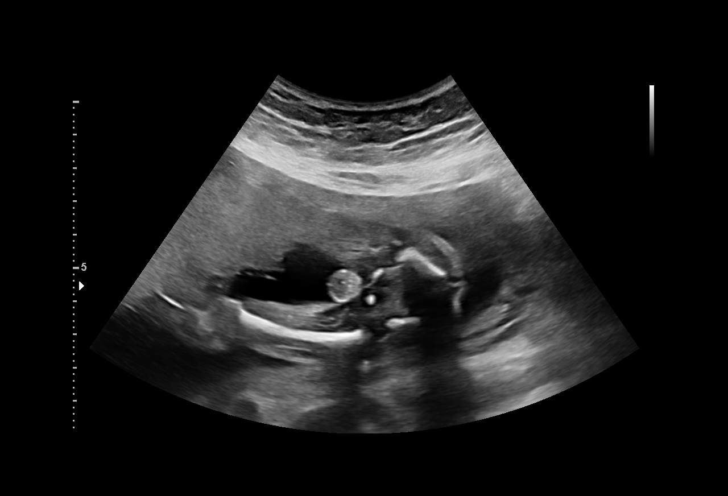
[im 57/118]
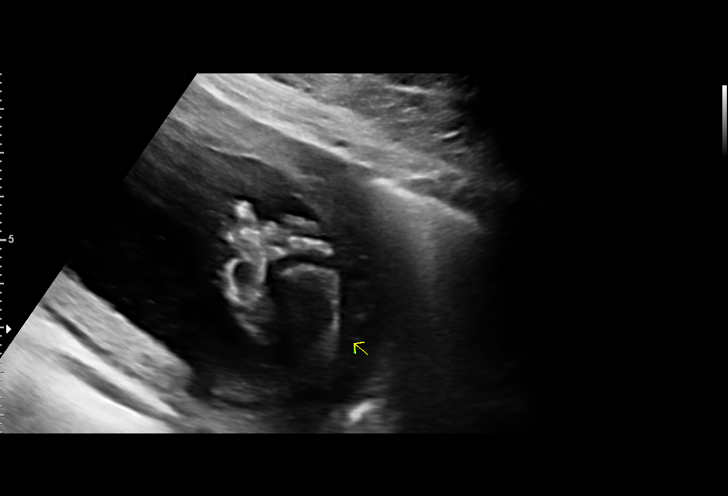
[im 66/118]
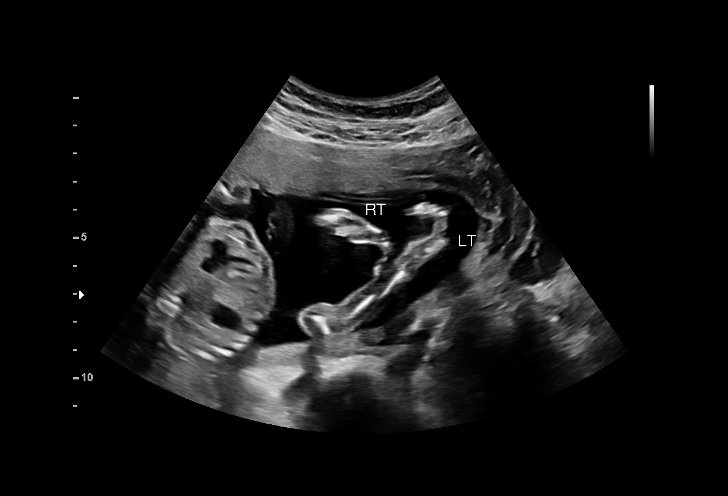
[im 74/118]
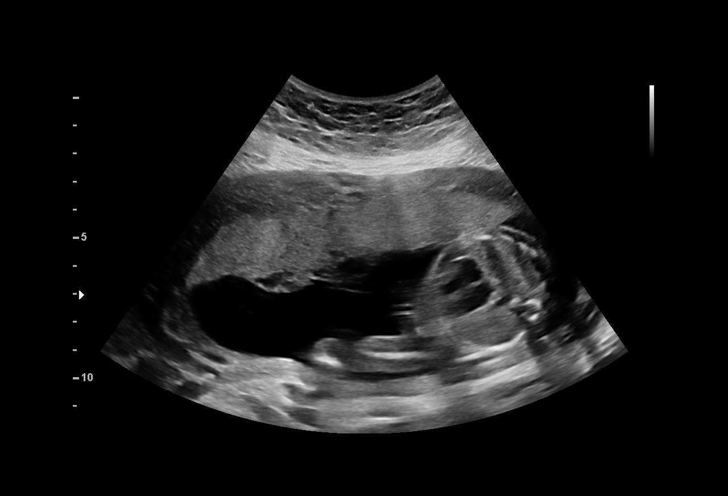
[im 83/118]
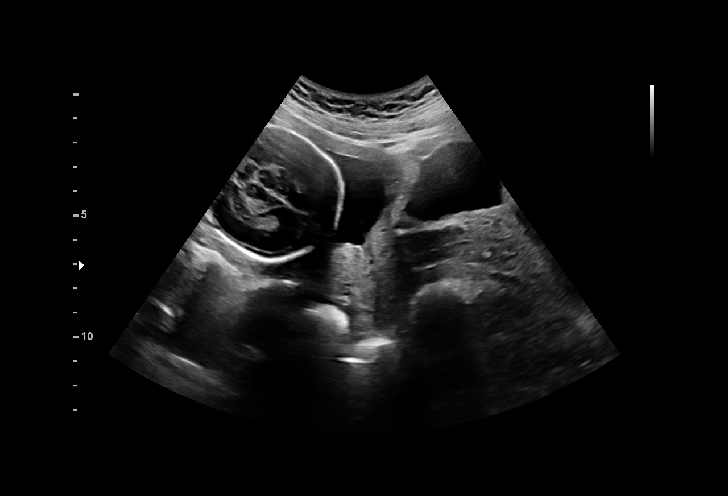
[im 92/118]
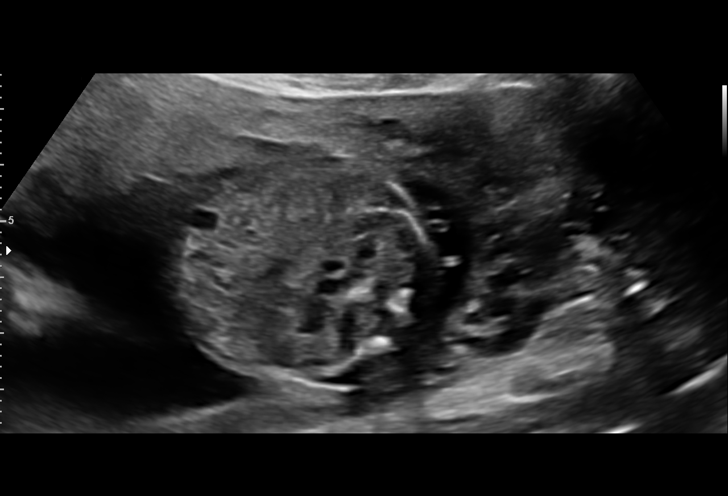
[im 100/118]
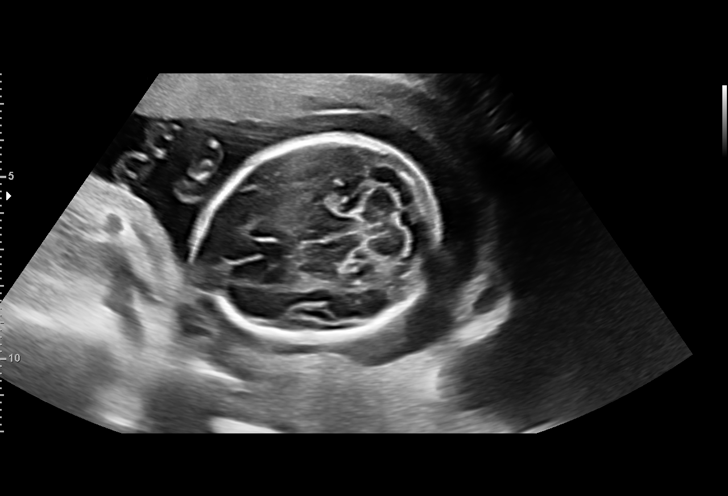
[im 109/118]
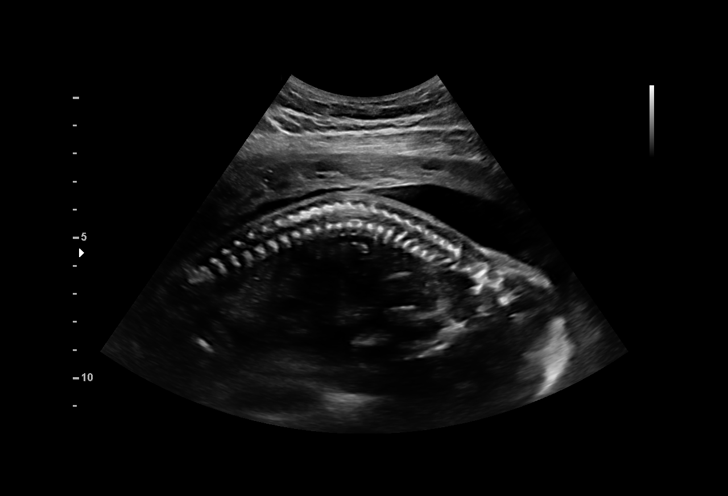
[im 118/118]
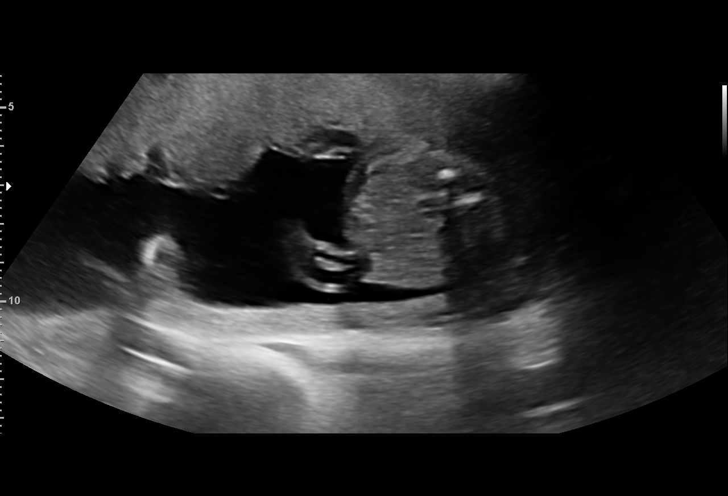

[14 of 28 positions shown; findings below may reference images not displayed]

[REDACTED]

1  AVNEET RECTOR            [id number]      [id number]     [id number]
Indications

21 weeks gestation of pregnancy
Encounter for fetal anatomic survey
Obesity complicating pregnancy
OB History

Blood Type:            Height:  5'8"   Weight (lb):  209      BMI:
Gravidity:    2         Term:   1
Living:       1
Fetal Evaluation

Num Of Fetuses:     1
Fetal Heart         150
Rate(bpm):
Cardiac Activity:   Observed
Presentation:       Cephalic
Placenta:           Anterior, above cervical os
P. Cord Insertion:  Visualized

Amniotic Fluid
AFI FV:      Subjectively within normal limits

Largest Pocket(cm)
4.89
Biometry

BPD:      53.3  mm     G. Age:  22w 1d         77  %    CI:        72.82   %   70 - 86
FL/HC:      19.9   %   15.9 -
HC:      198.6  mm     G. Age:  22w 0d         66  %    HC/AC:      1.23       1.06 -
AC:      161.5  mm     G. Age:  21w 2d         37  %    FL/BPD:     74.3   %
FL:       39.6  mm     G. Age:  22w 5d         82  %    FL/AC:      24.5   %   20 - 24
HUM:      36.4  mm     G. Age:  22w 5d         79  %
CER:      23.5  mm     G. Age:  21w 5d         60  %
CM:        3.8  mm

Est. FW:     464  gm          1 lb      54  %
Gestational Age

LMP:           21w 3d       Date:   08/24/16                 EDD:   05/31/17
U/S Today:     22w 0d                                        EDD:   05/27/17
Best:          21w 3d    Det. By:   LMP  (08/24/16)          EDD:   05/31/17
Anatomy

Cranium:               Appears normal         Aortic Arch:            Appears normal
Cavum:                 Appears normal         Ductal Arch:            Appears normal
Ventricles:            Appears normal         Diaphragm:              Appears normal
Choroid Plexus:        Appears normal         Stomach:                Appears normal, left
sided
Cerebellum:            Appears normal         Abdomen:                Appears normal
Posterior Fossa:       Appears normal         Abdominal Wall:         Appears nml (cord
insert, abd wall)
Nuchal Fold:           Not applicable (>20    Cord Vessels:           Appears normal (3
wks GA)                                        vessel cord)
Face:                  Appears normal         Kidneys:                Appear normal
(orbits and profile)
Lips:                  Appears normal         Bladder:                Appears normal
Thoracic:              Appears normal         Spine:                  Limited views
appear normal
Heart:                 Appears normal         Upper Extremities:      Appears normal;
(4CH, axis, and situs                          5th digit nsw
RVOT:                  Appears normal         Lower Extremities:      Appears normal
LVOT:                  Appears normal

Other:  Fetus appears to be a male. Heels visualized.
Cervix Uterus Adnexa

Cervix
Length:           3.27  cm.
Normal appearance by transabdominal scan.

Uterus
No abnormality visualized.

Left Ovary
Not visualized.

Right Ovary
Not visualized.
Impression

SIUP at 21+3 weeks
Normal detailed fetal anatomy
Markers of aneuploidy: none
Normal amniotic fluid volume
Measurements consistent with LMP dating
Recommendations
Follow-up as clinically indicated

## 2018-08-17 ENCOUNTER — Other Ambulatory Visit: Payer: Self-pay | Admitting: Medical

## 2018-08-17 DIAGNOSIS — Z3041 Encounter for surveillance of contraceptive pills: Secondary | ICD-10-CM

## 2018-11-11 ENCOUNTER — Other Ambulatory Visit: Payer: Self-pay | Admitting: Medical

## 2018-11-11 DIAGNOSIS — Z3041 Encounter for surveillance of contraceptive pills: Secondary | ICD-10-CM

## 2019-02-05 ENCOUNTER — Other Ambulatory Visit: Payer: Self-pay | Admitting: Medical

## 2019-02-05 DIAGNOSIS — Z3041 Encounter for surveillance of contraceptive pills: Secondary | ICD-10-CM

## 2019-05-07 ENCOUNTER — Other Ambulatory Visit: Payer: Self-pay

## 2019-05-07 DIAGNOSIS — Z20822 Contact with and (suspected) exposure to covid-19: Secondary | ICD-10-CM

## 2019-05-08 LAB — NOVEL CORONAVIRUS, NAA: SARS-CoV-2, NAA: NOT DETECTED

## 2019-09-01 ENCOUNTER — Other Ambulatory Visit: Payer: Self-pay

## 2019-09-01 DIAGNOSIS — Z3041 Encounter for surveillance of contraceptive pills: Secondary | ICD-10-CM

## 2019-09-02 MED ORDER — NORETHINDRONE 0.35 MG PO TABS: 1.0000 | ORAL_TABLET | Freq: Every day | ORAL | 0 refills | Status: DC

## 2019-09-02 NOTE — Telephone Encounter (Signed)
Called pt in regards to request for OCP refill. Pt reports she is currently taking the OCP's. She has not had an visit since Oct. 2018. Pt was informed I will send in 1 month prescription and she needs to make an appointment with the office. Message sent to front office to call pt and schedule her for routine exam.

## 2019-10-05 ENCOUNTER — Ambulatory Visit: Payer: BC Managed Care – PPO | Admitting: Certified Nurse Midwife

## 2019-10-05 ENCOUNTER — Encounter: Payer: Self-pay | Admitting: Family Medicine

## 2019-12-29 ENCOUNTER — Encounter: Payer: Self-pay | Admitting: General Practice

## 2019-12-29 DIAGNOSIS — Z3041 Encounter for surveillance of contraceptive pills: Secondary | ICD-10-CM

## 2019-12-29 MED ORDER — NORETHINDRONE 0.35 MG PO TABS: 1.0000 | ORAL_TABLET | Freq: Every day | ORAL | 1 refills | Status: DC

## 2020-08-19 NOTE — L&D Delivery Note (Addendum)
Delivery Note Called to [facility] for pt pushing involuntarily. Delivered in one contraction. At 4:24 PM a viable and healthy female was delivered via Vaginal, Spontaneous (Presentation: Left Occiput Anterior). Placed skin-to-skin w/ mom. Delayed cord clamping x 1 minute. Cord clamped x 2 and cut by mom. APGAR: 9, 9; weight pending.   Placenta status: Spontaneous, Intact.  Cord: 3 vessels with the following complications: None.  Cord pH: NA  Anesthesia: Epidural Episiotomy: None Lacerations: None Suture Repair: NA Est. Blood Loss (mL): 125  Mom to postpartum.  Baby to Couplet care / Skin to Skin. Placenta to: LD Feeding: Breast Circ: NA Contraception: PP IUD  Post-placental IUD Procedure Note Verified that patient still desired post-placental IUD for contraception. Discussed prior to delivery. Consent signed. No contraindications to PP IUD. Time out was performed. Changed into clean set of sterile gloves.   Liletta IUD removed from inserter and grasped w/ right hand. Placed in fundus w/ left hand supporting uterus externally as right hand was withdrawn. IUD stayed in place. Strings trimmed to inside of introitus. Pt tolerated procedure well.   Patient given post procedure instructions and IUD care card with expiration date.  Patient is asked to check IUD strings periodically and follow up in 4-6 weeks for IUD/PP check. Explained that strings will need to be trimmed. IUD does not protect against STDs.    Lot #[id number] Exp 09/2023  Dorathy Kinsman 08/20/2020, 5:34 PM

## 2020-08-20 ENCOUNTER — Inpatient Hospital Stay (HOSPITAL_COMMUNITY): Payer: Medicaid Other | Admitting: Anesthesiology

## 2020-08-20 ENCOUNTER — Encounter (HOSPITAL_COMMUNITY): Payer: Self-pay

## 2020-08-20 ENCOUNTER — Other Ambulatory Visit: Payer: Self-pay

## 2020-08-20 ENCOUNTER — Inpatient Hospital Stay (HOSPITAL_COMMUNITY)
Admission: AD | Admit: 2020-08-20 | Discharge: 2020-08-22 | DRG: 807 | Disposition: A | Discharge status: Home / Self Care | Payer: Medicaid Other | Attending: Obstetrics and Gynecology | Admitting: Obstetrics and Gynecology

## 2020-08-20 DIAGNOSIS — O4290 Premature rupture of membranes, unspecified as to length of time between rupture and onset of labor, unspecified weeks of gestation: Secondary | ICD-10-CM

## 2020-08-20 DIAGNOSIS — D573 Sickle-cell trait: Secondary | ICD-10-CM | POA: Diagnosis present

## 2020-08-20 DIAGNOSIS — Z3A40 40 weeks gestation of pregnancy: Secondary | ICD-10-CM

## 2020-08-20 DIAGNOSIS — O4202 Full-term premature rupture of membranes, onset of labor within 24 hours of rupture: Secondary | ICD-10-CM | POA: Diagnosis not present

## 2020-08-20 DIAGNOSIS — O26893 Other specified pregnancy related conditions, third trimester: Secondary | ICD-10-CM | POA: Diagnosis present

## 2020-08-20 DIAGNOSIS — O4292 Full-term premature rupture of membranes, unspecified as to length of time between rupture and onset of labor: Principal | ICD-10-CM | POA: Diagnosis present

## 2020-08-20 DIAGNOSIS — Z20822 Contact with and (suspected) exposure to covid-19: Secondary | ICD-10-CM | POA: Diagnosis present

## 2020-08-20 DIAGNOSIS — Z975 Presence of (intrauterine) contraceptive device: Secondary | ICD-10-CM | POA: Diagnosis not present

## 2020-08-20 DIAGNOSIS — Z9889 Other specified postprocedural states: Secondary | ICD-10-CM | POA: Diagnosis not present

## 2020-08-20 DIAGNOSIS — O9902 Anemia complicating childbirth: Secondary | ICD-10-CM | POA: Diagnosis present

## 2020-08-20 DIAGNOSIS — Z30430 Encounter for insertion of intrauterine contraceptive device: Secondary | ICD-10-CM

## 2020-08-20 DIAGNOSIS — O99824 Streptococcus B carrier state complicating childbirth: Secondary | ICD-10-CM | POA: Diagnosis present

## 2020-08-20 DIAGNOSIS — O480 Post-term pregnancy: Secondary | ICD-10-CM | POA: Diagnosis not present

## 2020-08-20 HISTORY — DX: Premature rupture of membranes, unspecified as to length of time between rupture and onset of labor, unspecified weeks of gestation: O42.90

## 2020-08-20 HISTORY — DX: Presence of (intrauterine) contraceptive device: Z97.5

## 2020-08-20 LAB — POCT FERN TEST: POCT Fern Test: POSITIVE

## 2020-08-20 LAB — RESP PANEL BY RT-PCR (FLU A&B, COVID) ARPGX2
Influenza A by PCR: NEGATIVE
Influenza B by PCR: NEGATIVE
SARS Coronavirus 2 by RT PCR: NEGATIVE

## 2020-08-20 LAB — CBC
HCT: 34.2 % — ABNORMAL LOW (ref 36.0–46.0)
Hemoglobin: 11.9 g/dL — ABNORMAL LOW (ref 12.0–15.0)
MCH: 27.7 pg (ref 26.0–34.0)
MCHC: 34.8 g/dL (ref 30.0–36.0)
MCV: 79.5 fL — ABNORMAL LOW (ref 80.0–100.0)
Platelets: 217 10*3/uL (ref 150–400)
RBC: 4.3 MIL/uL (ref 3.87–5.11)
RDW: 14.4 % (ref 11.5–15.5)
WBC: 6 10*3/uL (ref 4.0–10.5)
nRBC: 0 % (ref 0.0–0.2)

## 2020-08-20 LAB — TYPE AND SCREEN
ABO/RH(D): B POS
Antibody Screen: NEGATIVE

## 2020-08-20 MED ORDER — FENTANYL-BUPIVACAINE-NACL 0.5-0.125-0.9 MG/250ML-% EP SOLN
EPIDURAL | Status: AC
  Filled 2020-08-20: qty 250

## 2020-08-20 MED ORDER — LIDOCAINE HCL (PF) 1 % IJ SOLN
INTRAMUSCULAR | Status: DC | PRN
  Administered 2020-08-20: 2 mL via EPIDURAL
  Administered 2020-08-20: 5 mL via EPIDURAL
  Administered 2020-08-20: 3 mL via EPIDURAL

## 2020-08-20 MED ORDER — ACETAMINOPHEN 325 MG PO TABS: 650.0000 mg | ORAL_TABLET | ORAL | Status: DC | PRN

## 2020-08-20 MED ORDER — SODIUM CHLORIDE 0.9 % IV SOLN
5.0000 10*6.[IU] | Freq: Once | INTRAVENOUS | Status: AC
  Administered 2020-08-20: 5 10*6.[IU] via INTRAVENOUS
  Filled 2020-08-20: qty 5

## 2020-08-20 MED ORDER — ONDANSETRON HCL 4 MG/2ML IJ SOLN: 4.0000 mg | INTRAMUSCULAR | Status: DC | PRN

## 2020-08-20 MED ORDER — DIPHENHYDRAMINE HCL 50 MG/ML IJ SOLN: 12.5000 mg | INTRAMUSCULAR | Status: DC | PRN

## 2020-08-20 MED ORDER — ZOLPIDEM TARTRATE 5 MG PO TABS: 5.0000 mg | ORAL_TABLET | Freq: Every evening | ORAL | Status: DC | PRN

## 2020-08-20 MED ORDER — OXYTOCIN-SODIUM CHLORIDE 30-0.9 UT/500ML-% IV SOLN
1.0000 m[IU]/min | INTRAVENOUS | Status: DC
  Administered 2020-08-20: 2 m[IU]/min via INTRAVENOUS

## 2020-08-20 MED ORDER — BENZOCAINE-MENTHOL 20-0.5 % EX AERO: 1.0000 "application " | INHALATION_SPRAY | CUTANEOUS | Status: DC | PRN

## 2020-08-20 MED ORDER — LEVONORGESTREL 19.5 MCG/DAY IU IUD
INTRAUTERINE_SYSTEM | Freq: Once | INTRAUTERINE | Status: AC
  Administered 2020-08-20: 17:00:00 1 via INTRAUTERINE

## 2020-08-20 MED ORDER — ONDANSETRON HCL 4 MG/2ML IJ SOLN: 4.0000 mg | Freq: Four times a day (QID) | INTRAMUSCULAR | Status: DC | PRN

## 2020-08-20 MED ORDER — SODIUM CHLORIDE (PF) 0.9 % IJ SOLN
INTRAMUSCULAR | Status: DC | PRN
  Administered 2020-08-20: 12 mL/h via EPIDURAL

## 2020-08-20 MED ORDER — PENICILLIN G POT IN DEXTROSE 60000 UNIT/ML IV SOLN
3.0000 10*6.[IU] | INTRAVENOUS | Status: DC
  Administered 2020-08-20: 3 10*6.[IU] via INTRAVENOUS
  Filled 2020-08-20: qty 50

## 2020-08-20 MED ORDER — LACTATED RINGERS IV SOLN: 500.0000 mL | Freq: Once | INTRAVENOUS | Status: DC

## 2020-08-20 MED ORDER — DIPHENHYDRAMINE HCL 25 MG PO CAPS: 25.0000 mg | ORAL_CAPSULE | Freq: Four times a day (QID) | ORAL | Status: DC | PRN

## 2020-08-20 MED ORDER — OXYCODONE-ACETAMINOPHEN 5-325 MG PO TABS: 2.0000 | ORAL_TABLET | ORAL | Status: DC | PRN

## 2020-08-20 MED ORDER — DIBUCAINE (PERIANAL) 1 % EX OINT: 1.0000 "application " | TOPICAL_OINTMENT | CUTANEOUS | Status: DC | PRN

## 2020-08-20 MED ORDER — LEVONORGESTREL 19.5 MCG/DAY IU IUD
INTRAUTERINE_SYSTEM | INTRAUTERINE | Status: AC
  Filled 2020-08-20: qty 1

## 2020-08-20 MED ORDER — LIDOCAINE HCL (PF) 1 % IJ SOLN: 30.0000 mL | INTRAMUSCULAR | Status: DC | PRN

## 2020-08-20 MED ORDER — COCONUT OIL OIL: 1.0000 "application " | TOPICAL_OIL | Status: DC | PRN

## 2020-08-20 MED ORDER — OXYCODONE-ACETAMINOPHEN 5-325 MG PO TABS: 1.0000 | ORAL_TABLET | ORAL | Status: DC | PRN

## 2020-08-20 MED ORDER — LACTATED RINGERS IV SOLN: 500.0000 mL | INTRAVENOUS | Status: DC | PRN

## 2020-08-20 MED ORDER — MAGNESIUM HYDROXIDE 400 MG/5ML PO SUSP: 30.0000 mL | ORAL | Status: DC | PRN

## 2020-08-20 MED ORDER — TERBUTALINE SULFATE 1 MG/ML IJ SOLN: 0.2500 mg | Freq: Once | INTRAMUSCULAR | Status: DC | PRN

## 2020-08-20 MED ORDER — OXYTOCIN-SODIUM CHLORIDE 30-0.9 UT/500ML-% IV SOLN: 2.5000 [IU]/h | INTRAVENOUS | Status: DC

## 2020-08-20 MED ORDER — OXYTOCIN BOLUS FROM INFUSION
333.0000 mL | Freq: Once | INTRAVENOUS | Status: AC
  Administered 2020-08-20: 333 mL via INTRAVENOUS

## 2020-08-20 MED ORDER — IBUPROFEN 600 MG PO TABS
600.0000 mg | ORAL_TABLET | Freq: Four times a day (QID) | ORAL | Status: DC
  Administered 2020-08-20 – 2020-08-22 (×6): 600 mg via ORAL
  Filled 2020-08-20 (×6): qty 1

## 2020-08-20 MED ORDER — FLEET ENEMA 7-19 GM/118ML RE ENEM: 1.0000 | ENEMA | RECTAL | Status: DC | PRN

## 2020-08-20 MED ORDER — SOD CITRATE-CITRIC ACID 500-334 MG/5ML PO SOLN: 30.0000 mL | ORAL | Status: DC | PRN

## 2020-08-20 MED ORDER — LACTATED RINGERS IV SOLN
500.0000 mL | Freq: Once | INTRAVENOUS | Status: AC
  Administered 2020-08-20: 500 mL via INTRAVENOUS

## 2020-08-20 MED ORDER — ONDANSETRON HCL 4 MG PO TABS: 4.0000 mg | ORAL_TABLET | ORAL | Status: DC | PRN

## 2020-08-20 MED ORDER — FENTANYL CITRATE (PF) 100 MCG/2ML IJ SOLN: 50.0000 ug | INTRAMUSCULAR | Status: DC | PRN

## 2020-08-20 MED ORDER — SIMETHICONE 80 MG PO CHEW: 80.0000 mg | CHEWABLE_TABLET | ORAL | Status: DC | PRN

## 2020-08-20 MED ORDER — OXYTOCIN BOLUS FROM INFUSION: 333.0000 mL | Freq: Once | INTRAVENOUS | Status: DC

## 2020-08-20 MED ORDER — WITCH HAZEL-GLYCERIN EX PADS: 1.0000 "application " | MEDICATED_PAD | CUTANEOUS | Status: DC | PRN

## 2020-08-20 MED ORDER — TETANUS-DIPHTH-ACELL PERTUSSIS 5-2.5-18.5 LF-MCG/0.5 IM SUSY: 0.5000 mL | PREFILLED_SYRINGE | Freq: Once | INTRAMUSCULAR | Status: DC

## 2020-08-20 MED ORDER — EPHEDRINE 5 MG/ML INJ: 10.0000 mg | INTRAVENOUS | Status: DC | PRN

## 2020-08-20 MED ORDER — FENTANYL-BUPIVACAINE-NACL 0.5-0.125-0.9 MG/250ML-% EP SOLN: 12.0000 mL/h | EPIDURAL | Status: DC | PRN

## 2020-08-20 MED ORDER — PHENYLEPHRINE 40 MCG/ML (10ML) SYRINGE FOR IV PUSH (FOR BLOOD PRESSURE SUPPORT): 80.0000 ug | PREFILLED_SYRINGE | INTRAVENOUS | Status: DC | PRN

## 2020-08-20 MED ORDER — OXYTOCIN-SODIUM CHLORIDE 30-0.9 UT/500ML-% IV SOLN
2.5000 [IU]/h | INTRAVENOUS | Status: DC
  Administered 2020-08-20: 2.5 [IU]/h via INTRAVENOUS
  Filled 2020-08-20: qty 500

## 2020-08-20 MED ORDER — LACTATED RINGERS IV SOLN: INTRAVENOUS | Status: DC

## 2020-08-20 MED ORDER — MEASLES, MUMPS & RUBELLA VAC IJ SOLR: 0.5000 mL | Freq: Once | INTRAMUSCULAR | Status: DC

## 2020-08-20 MED ORDER — PRENATAL MULTIVITAMIN CH
1.0000 | ORAL_TABLET | Freq: Every day | ORAL | Status: DC
  Administered 2020-08-21: 1 via ORAL
  Filled 2020-08-20: qty 1

## 2020-08-20 NOTE — Lactation Note (Signed)
This note was copied from a baby's chart. Lactation Consultation Note  Patient Name: Katie Mayer OIBBC'W Date: 08/20/2020   Age:30 hours  Attempted to visit with mom again, but her RN told LC; she was getting her ready to transfer to the [facility] unit, baby did not have a chance to latch on while in L&D. Mom voiced that her goal is to breastfeed, but she'll supplement if she had to. LC will attempt to visit mother later after she gets admitted to [facility] in room 411.  Maternal Data    Feeding    LATCH Score                   Interventions    Lactation Tools Discussed/Used     Consult Status      [name] Venetia Constable 08/20/2020, 6:02 PM

## 2020-08-20 NOTE — MAU Provider Note (Signed)
Ms. Channing Savich is a 30 y.o. [id number] female at [redacted]w[redacted]d weeks gestation presenting to MAU with complaints of leaking fluid. MAU provider was requested by RN to verify presentation.  NST - FHR: 140 bpm / moderate variability / accels present / decels absent / TOCO: regular UC's, but not feeling   Patient informed that the ultrasound is considered a limited OB ultrasound and is not intended to be a complete ultrasound exam.  Patient also informed that the ultrasound is not being completed with the intent of assessing for fetal or placental anomalies or any pelvic abnormalities.  Explained that the purpose of today's ultrasound is to assess for presentation.  Baby was found to be in a cephalic presentation. Patient acknowledges the purpose of the exam and the limitations of the study.  Raelyn Mora, CNM  08/20/2020 9:54 AM

## 2020-08-20 NOTE — Lactation Note (Signed)
This note was copied from a baby's chart. Lactation Consultation Note  Patient Name: Katie Mayer Date: 08/20/2020 Reason for consult: Initial assessment;Term Age:30 hours  Visited with mom of a 3 hours old FT female, she's a P3 and experienced BF. She BF her first child for 3-4 months and her last one for 1 year and 8 months. Baby didn't BF in L&D she had a light meconium stain fluid. Mom is already familiar with hand expression and able to easily get colostrum when doing it, her tissue is very compressible, praise her for her efforts.   Baby already nursing when entering the room, but noticed that mom had her swaddled in a blanket and latch was shallow. Offered assistance with repositioning and mom agreed to do STS. LC took baby back to the right breast STS in cross cradle position and she was able to latch easily, a few audible swallows noted upon breast compressions.   Baby still nursing when exiting the room at the 25 minutes mark. According to mom, this was baby's first feeding. Reviewed normal newborn behavior, feeding cues, size of baby's stomach and cluster feeding.  Feeding plan:  1. Encouraged mom to feed baby STS 8-12 times/24 hours or sooner if feeding cues are present 2. Hand expression and spoon/finger feeding were also encouraged  BF brochure, BF resources and feeding diary were reviewed. No support person in mom's room at the time of [facility] consultation. Mom reported all questions and concerns were answered, she's aware of LC OP services and will call PRN.   Maternal Data Formula Feeding for Exclusion: No Has patient been taught Hand Expression?: Yes Does the patient have breastfeeding experience prior to this delivery?: Yes  Feeding Feeding Type: Breast Fed  LATCH Score Latch: Grasps breast easily, tongue down, lips flanged, rhythmical sucking.  Audible Swallowing: A few with stimulation  Type of Nipple: Everted at rest and after  stimulation  Comfort (Breast/Nipple): Soft / non-tender  Hold (Positioning): Assistance needed to correctly position infant at breast and maintain latch.  LATCH Score: 8  Interventions Interventions: Breast feeding basics reviewed;Assisted with latch;Skin to skin;Breast massage;Hand express;Breast compression;Adjust position;Support pillows  Lactation Tools Discussed/Used     Consult Status Consult Status: Follow-up Date: 08/21/20 Follow-up type: In-patient    Daphine Loch Venetia Constable 08/20/2020, 8:19 PM

## 2020-08-20 NOTE — H&P (Signed)
HPI: Katie Mayer is a 30 y.o. year old [id number] female at [redacted]w[redacted]d weeks gestation by LMP/20 wk [place] who presents to MAU reporting SROM yellow fluid at 0500 and rare contractions. Denies vaginal bleeding. Nml fetal mvmt.   Prenatal care at [facility].   Pregnancy significant for:  Patient Active Problem List   Diagnosis Date Noted  . 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 Anemia   . 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 HPV (human papilloma virus) anogenital infection    Past Surgical History:  Procedure Laterality Date  . NO PAST SURGERIES     Family History: family history includes Hypertension in her father and mother. Social History:  reports that she has never smoked. She has never used smokeless tobacco. She reports that she does not drink alcohol and does not use drugs.     Maternal Diabetes: No Genetic Screening: Normal Maternal Ultrasounds/Referrals: Normal Fetal Ultrasounds or other Referrals:  None Maternal Substance Abuse:  No Significant Maternal Medications:  None Significant Maternal Lab Results:  Group B Strep positive Other Comments:  None  Review of Systems Maternal Medical History:  Reason for admission: Rupture of membranes.   Contractions: Frequency: rare.   Perceived severity is mild.    Fetal activity: Perceived fetal activity is normal.    Prenatal complications: No PIH, infection, IUGR, oligohydramnios or polyhydramnios.   Prenatal Complications - Diabetes: none.    Dilation: 4 Effacement (%):  (thick) Station: -3 Exam by:: Dorathy Kinsman, CNM Blood pressure 112/71, pulse 90, temperature 97.8 F (36.6 C), temperature source Oral, resp. rate 18, height 5\' 8"  (1.727 m), weight 91.2 kg, SpO2  100 %, currently breastfeeding. Maternal Exam:  Uterine Assessment: Contraction strength is mild.  Contraction frequency is rare.   Abdomen: Patient reports no abdominal tenderness. Estimated fetal weight is 7 lb.   Fetal presentation: vertex  Introitus: Normal vulva. Vulva is negative for lesion.  Ferning test: positive.  Amniotic fluid character: meconium stained.  Pelvis: adequate for delivery.   Cervix: Cervix evaluated by digital exam.     Fetal Exam Fetal Monitor Review: Baseline rate: 135.  Variability: moderate (6-25 bpm).   Pattern: accelerations present, variable decelerations and late decelerations.    Fetal State Assessment: Category I - tracings are normal. Initially with few mild variables and questionable late decels, but overall reassuring. Now Category I.  Physical Exam Constitutional:      General: She is not in acute distress.    Appearance: Normal appearance. She is normal weight. She is not ill-appearing.  Eyes:     Conjunctiva/sclera: Conjunctivae normal.  Cardiovascular:     Rate and Rhythm: Normal rate.     Pulses: Normal pulses.  Pulmonary:     Effort: Pulmonary effort is normal. No respiratory distress.     Breath sounds: Normal breath sounds.  Abdominal:     Tenderness: There is no abdominal tenderness.  Genitourinary:    General: Normal vulva.  Vulva is no lesion.  Musculoskeletal:  General: No tenderness.     Right lower leg: Edema (1+) present.     Left lower leg: Edema (1+) present.  Skin:    General: Skin is warm and dry.     Coloration: Skin is not pale.     Findings: No rash.  Neurological:     Mental Status: She is alert and oriented to person, place, and time.  Psychiatric:        Mood and Affect: Mood normal.     Prenatal labs: ABO, Rh: --/--/B POS (01/02 1000) Antibody: NEG (01/02 1000) Rubella:  Non-immune RPR:   NR HBsAg:   Neg HIV:   NR GBS:   Pos in urine Quad Neg Sickle cell trait  Assessment: 1. Labor:  PROM x 7+ hours 2. Fetal Wellbeing: Category I-II, reassuring  3. Pain Control: None. Plans epidural in labor 4. GBS: Pos 5. 40.3 week IUP 6. Light Meconium stained fluid.  Plan:  1. Admit to BS per consult with MD 2. Routine L&D orders 3. Analgesia/anesthesia PRN  4. PCN for GBS 5. CTO FHR tracing closely. Reactive now.  6. Start pitocin   [place] 08/20/2020, 12:30 PM

## 2020-08-20 NOTE — Anesthesia Procedure Notes (Signed)
Epidural Patient location during procedure: OB Start time: 08/20/2020 3:35 PM End time: 08/20/2020 3:41 PM  Staffing Anesthesiologist: Marcene Duos, MD Performed: anesthesiologist   Preanesthetic Checklist Completed: patient identified, IV checked, site marked, risks and benefits discussed, surgical consent, monitors and equipment checked, pre-op evaluation and timeout performed  Epidural Patient position: sitting Prep: DuraPrep and site prepped and draped Patient monitoring: continuous pulse ox and blood pressure Approach: midline Location: L4-L5 Injection technique: LOR air  Needle:  Needle type: Tuohy  Needle gauge: 17 G Needle length: 9 cm and 9 Needle insertion depth: 7 cm Catheter type: closed end flexible Catheter size: 19 Gauge Catheter at skin depth: 12 cm Test dose: negative  Assessment Events: blood not aspirated, injection not painful, no injection resistance, no paresthesia and negative IV test

## 2020-08-20 NOTE — MAU Note (Signed)
.   Katie Mayer is a 30 y.o. at Unknown here in MAU reporting: SROM of yellow colored fluid at 0500 this morning. Endorses good fetal movement. Reports occasional ctx.  Vitals:   08/20/20 0944  BP: 121/79  Pulse: (!) 107  Resp: 16  Temp: 98.3 F (36.8 C)  SpO2: 100%     FHT:145 :

## 2020-08-20 NOTE — Plan of Care (Signed)
  Problem: Education: Goal: Knowledge of Childbirth will improve Outcome: Progressing Goal: Ability to make informed decisions regarding treatment and plan of care will improve Outcome: Progressing Goal: Ability to state and carry out methods to decrease the pain will improve Outcome: Progressing Goal: Individualized Educational Video(s) Outcome: Progressing   Problem: Coping: Goal: Ability to verbalize concerns and feelings about labor and delivery will improve Outcome: Progressing   Problem: Life Cycle: Goal: Ability to make normal progression through stages of labor will improve Outcome: Progressing   Problem: Life Cycle: Goal: Ability to make normal progression through stages of labor will improve Outcome: Progressing Goal: Ability to effectively push during vaginal delivery will improve Outcome: Progressing   Problem: Safety: Goal: Risk of complications during labor and delivery will decrease Outcome: Progressing   Problem: Pain Management: Goal: Relief or control of pain from uterine contractions will improve Outcome: Progressing   Problem: Health Behavior/Discharge Planning: Goal: Ability to manage health-related needs will improve Outcome: Progressing   Problem: Activity: Goal: Risk for activity intolerance will decrease Outcome: Progressing   Problem: Nutrition: Goal: Adequate nutrition will be maintained Outcome: Progressing   Problem: Coping: Goal: Level of anxiety will decrease Outcome: Progressing   Problem: Elimination: Goal: Will not experience complications related to bowel motility Outcome: Progressing Goal: Will not experience complications related to urinary retention Outcome: Progressing   Problem: Skin Integrity: Goal: Risk for impaired skin integrity will decrease Outcome: Progressing

## 2020-08-20 NOTE — Lactation Note (Signed)
This note was copied from a baby's chart. Lactation Consultation Note  Patient Name: Katie Mayer [id number]'[id number] Date: 08/20/2020   Age:30 hours   Attempted to visit with mom in L&D but her RN asked LC to come back at a later time, she was getting ready to use the bathroom.    Maternal Data    Feeding    LATCH Score                   Interventions    Lactation Tools Discussed/Used     Consult Status      Katie Mayer 08/20/2020, 5:48 PM

## 2020-08-20 NOTE — Anesthesia Preprocedure Evaluation (Signed)
Anesthesia Evaluation  Patient identified by MRN, date of birth, ID band Patient awake    Reviewed: Allergy & Precautions, Patient's Chart, lab work & pertinent test results  Airway Mallampati: II       Dental   Pulmonary neg pulmonary ROS,    Pulmonary exam normal        Cardiovascular negative cardio ROS Normal cardiovascular exam     Neuro/Psych negative neurological ROS     GI/Hepatic negative GI ROS, Neg liver ROS,   Endo/Other  negative endocrine ROS  Renal/GU negative Renal ROS     Musculoskeletal   Abdominal   Peds  Hematology negative hematology ROS (+)   Anesthesia Other Findings   Reproductive/Obstetrics (+) Pregnancy                             Anesthesia Physical Anesthesia Plan  ASA: II  Anesthesia Plan: Epidural   Post-op Pain Management:    Induction:   PONV Risk Score and Plan: Treatment may vary due to age or medical condition  Airway Management Planned: Natural Airway  Additional Equipment:   Intra-op Plan:   Post-operative Plan:   Informed Consent: I have reviewed the patients History and Physical, chart, labs and discussed the procedure including the risks, benefits and alternatives for the proposed anesthesia with the patient or authorized representative who has indicated his/her understanding and acceptance.       Plan Discussed with:   Anesthesia Plan Comments:         Anesthesia Quick Evaluation  

## 2020-08-20 NOTE — Discharge Summary (Signed)
Postpartum Discharge Summary   Patient Name: Katie Mayer DOB: 05/02/1991 MRN: [id number]  Date of admission: 08/20/2020 Delivery date:08/20/2020  Delivering provider: Manya Silvas  Date of discharge: 08/22/2020  Admitting diagnosis: Normal labor [O80, Z37.9] Leakage, amniotic fluid [O42.90] Intrauterine pregnancy: [redacted]w[redacted]d    Secondary diagnosis:  Active Problems:   Normal labor   Leakage, amniotic fluid   Vaginal delivery  Post-placental  IUD (intrauterine device) in place  Additional problems: as noted above  Discharge diagnosis: Term Pregnancy Delivered                                              Post partum procedures:Post-placental IUD placement Augmentation: Pitocin Complications: None  Hospital course: Onset of Labor With Vaginal Delivery      30y.o. yo G3P2002 at 474w3das admitted in Latent Labor w/ PROM on 08/20/2020. Labor augmented with pitocin. PCN for GBS prophylaxis.  Membrane Rupture Time/Date: 5:00 AM ,08/20/2020   Delivery Method:Vaginal, Spontaneous  Episiotomy: None  Lacerations:  None   PP IUD placed.  Patient had an uncomplicated postpartum course.  She is ambulating, tolerating a regular diet, passing flatus, and urinating well. Patient is discharged home in stable condition on 08/22/20.  Newborn Data: Birth date:08/20/2020  Birth time:4:24 PM  Gender:Female  Living status:Living  Apgars:9 ,9  Weight:3075 g   Magnesium Sulfate received: No BMZ received: Yes Rhophylac:N/A MMR:N/A T-DaP: offered prior to discharge Flu: offered prior to discharge Transfusion:No  Physical exam  Vitals:   08/21/20 0329 08/21/20 1300 08/21/20 2355 08/22/20 0500  BP: 104/69 108/69 102/64 110/69  Pulse: 96 86    Resp: _0 Temp: 97.8 F (36.6 C) 98.1 F (36.7 C) 98.3 F (36.8 C) 98 F (36.7 C)  TempSrc: Oral Oral Oral Oral  SpO2: 99%  100% 100%  Weight:      Height:       General: alert, cooperative and no distress Lochia:  appropriate Uterine Fundus: firm Incision: N/A DVT Evaluation: No evidence of DVT seen on physical exam. No cords or calf tenderness. No significant calf/ankle edema. Labs: Lab Results  Component Value Date   WBC 6.0 08/20/2020   HGB 11.9 (L) 08/20/2020   HCT 34.2 (L) 08/20/2020   MCV 79.5 (L) 08/20/2020   PLT 217 08/20/2020   No flowsheet data found. Edinburgh Score: Edinburgh Postnatal Depression Scale Screening Tool 08/21/2020  I have been able to laugh and see the funny side of things. 0  I have looked forward with enjoyment to things. 0  I have blamed myself unnecessarily when things went wrong. 1  I have been anxious or worried for no good reason. 0  I have felt scared or panicky for no good reason. 1  Things have been getting on top of me. 1  I have been so unhappy that I have had difficulty sleeping. 0  I have felt sad or miserable. 0  I have been so unhappy that I have been crying. 0  The thought of harming myself has occurred to me. 0  Edinburgh Postnatal Depression Scale Total 3     After visit meds:  Allergies as of 08/22/2020   No Known Allergies     Medication List    STOP taking these medications   norethindrone 0.35 MG tablet Commonly known as: [employer]  TAKE these medications   acetaminophen 325 MG tablet Commonly known as: Tylenol Take 2 tablets (650 mg total) by mouth every 6 (six) hours as needed for mild pain, moderate pain, fever or headache.   coconut oil Oil Apply 1 application topically as needed (nipple pain).   ibuprofen 600 MG tablet Commonly known as: ADVIL Take 1 tablet (600 mg total) by mouth every 8 (eight) hours as needed for moderate pain or cramping.   multivitamin-prenatal 27-0.8 MG Tabs tablet Take 1 tablet by mouth daily at 12 noon.        Discharge home in stable condition Infant Feeding: Breast Infant Disposition:home with mother Discharge instruction: per After Visit Summary and Postpartum booklet. Activity:  Advance as tolerated. Pelvic rest for 6 weeks.  Diet: routine diet Future Appointments:No future appointments. Follow up Visit:  Follow-up Information    Department, [facility]. Schedule an appointment as soon as possible for a visit in 4 week(s).   Why: Make appointment to be seen for postpartum care in 4-6 weeks  Contact information: [place] [address] [phone]              Pt instructed to call HD to schedule PP appt.  Please schedule this patient for a In person postpartum visit in 4 weeks with the following provider: Any provider. Additional Postpartum F/U:None  Low risk pregnancy complicated by: Nothing Delivery mode:  Vaginal, Spontaneous  Anticipated Birth Control:  PP IUD placed   Lajean Manes, CNM 08/22/20, 7:57 AM

## 2020-08-21 ENCOUNTER — Encounter (HOSPITAL_COMMUNITY): Payer: Self-pay | Admitting: Obstetrics and Gynecology

## 2020-08-21 DIAGNOSIS — Z975 Presence of (intrauterine) contraceptive device: Secondary | ICD-10-CM

## 2020-08-21 DIAGNOSIS — Z9889 Other specified postprocedural states: Secondary | ICD-10-CM

## 2020-08-21 LAB — RPR: RPR Ser Ql: NONREACTIVE

## 2020-08-21 NOTE — Anesthesia Postprocedure Evaluation (Signed)
Anesthesia Post Note  Patient: Demetria Iwai  Procedure(s) Performed: AN AD HOC LABOR EPIDURAL     Patient location during evaluation: Mother Baby Anesthesia Type: Epidural Level of consciousness: awake and alert Pain management: pain level controlled Vital Signs Assessment: post-procedure vital signs reviewed and stable Respiratory status: spontaneous breathing, nonlabored ventilation and respiratory function stable Cardiovascular status: stable Postop Assessment: no headache, no backache and epidural receding Anesthetic complications: no   No complications documented.  Last Vitals:  Vitals:   08/20/20 2333 08/21/20 0329  BP: 113/74 104/69  Pulse: 97 96  Resp:  16  Temp: 36.6 C 36.6 C  SpO2: 100% 99%    Last Pain:  Vitals:   08/21/20 0329  TempSrc: Oral  PainSc:    Pain Goal: Patients Stated Pain Goal: 9 (08/20/20 1504)                 Junious Silk

## 2020-08-21 NOTE — Progress Notes (Signed)
POSTPARTUM PROGRESS NOTE  Subjective: Katie Mayer is a 30 y.o. [phone] s/p vaginal delivery at [redacted]w[redacted]d.  She reports she doing well. No acute events overnight. She denies any problems with ambulating, voiding or po intake. Denies nausea or vomiting. She has passed flatus. Pain is well controlled.  Lochia is minimal. Pt prefers to stay in the hospital until tomorrow.  Objective: Blood pressure 104/69, pulse 96, temperature 97.8 F (36.6 C), temperature source Oral, resp. rate 16, height 5\' 8"  (1.727 m), weight 91.2 kg, SpO2 99 %, unknown if currently breastfeeding.  Physical Exam:  General: alert, cooperative and no distress Chest: no respiratory distress Abdomen: soft, non-tender  Uterine Fundus: firm and at level of umbilicus Extremities: No calf swelling or tenderness  no LE edema  Recent Labs    08/20/20 1000  HGB 11.9*  HCT 34.2*    Assessment/Plan: Katie Mayer is a 30 y.o. 37 s/p vaginal delivery at [redacted]w[redacted]d.  Routine Postpartum Care: Doing well, pain well-controlled.  -- Continue routine care, lactation support  -- Contraception: breast -- Feeding: s/p PP IUD placement  Dispo: Plan for discharge PPD#2.  [redacted]w[redacted]d, MD OB Fellow, [facility] 08/21/2020 9:17 AM

## 2020-08-21 NOTE — Lactation Note (Addendum)
This note was copied from a baby's chart. Lactation Consultation Note  Patient Name: Katie Mayer [id number] Date: 08/21/2020 Reason for consult: Follow-up assessment Age:30 hours   Arrived in mothers room , she was breastfeeding infant and reports that she keeps falling asleep. Advised mother to remove blankets and clothes. Observed good flow of colostrum when hand expressed.  Infant placed in football hold and infant latched well. Mother independently latched infant.   Mother to continue to cue base feed infant and feed at least 8-12 times or more in 24 hours and advised to allow for cluster feeding infant as needed.  Mother to continue to due STS. Mother is aware of available LC services at [facility], [facility], OP Dept, and phone # for questions or concerns about breastfeeding.  Mother receptive to all teaching and plan of care.     Maternal Data    Feeding Feeding Type: Breast Fed  LATCH Score Latch: Grasps breast easily, tongue down, lips flanged, rhythmical sucking.  Audible Swallowing: Spontaneous and intermittent  Type of Nipple: Everted at rest and after stimulation  Comfort (Breast/Nipple): Soft / non-tender  Hold (Positioning): Assistance needed to correctly position infant at breast and maintain latch.  LATCH Score: 9  Interventions Interventions: Breast compression;Assisted with latch;Skin to skin;Adjust position;Support pillows;Position options  Lactation Tools Discussed/Used     Consult Status Consult Status: Follow-up Date: 08/22/20 Follow-up type: In-patient    Stevan Born [facility] 08/21/2020, 2:33 PM

## 2020-08-22 MED ORDER — ACETAMINOPHEN 325 MG PO TABS: 650.0000 mg | ORAL_TABLET | Freq: Four times a day (QID) | ORAL | Status: DC | PRN

## 2020-08-22 MED ORDER — COCONUT OIL OIL: 1.0000 "application " | TOPICAL_OIL | 0 refills | Status: DC | PRN

## 2020-08-22 MED ORDER — IBUPROFEN 600 MG PO TABS
600.0000 mg | ORAL_TABLET | Freq: Three times a day (TID) | ORAL | 0 refills | Status: DC | PRN
Start: 2020-08-22 — End: 2023-09-12

## 2020-08-22 NOTE — Lactation Note (Signed)
This note was copied from a baby's chart. Lactation Consultation Note  Patient Name: Katie Mayer [id number] Date: 08/22/2020 Reason for consult: Follow-up assessment Age:30 hours   Infant is at 8% wt loss this am.  Mother eating breakfast when I came in the room. Infant crying. Mother last fed infant at 4;35 this am.  Mtoher latching infant on in football hold. Infant latched with shallow latch. Mother taught to flange infants lips for deeper  latch.  Observed infant with good suck / swallow pattern.  Explained to mother feeding cues when infant is hunger and when infant is satisfied.   Discussed treatment and prevention of engorgement.  Mother reports that she is somewhat sore on the tips of her nipples. She was give comfort gels. She was given guidelines on using pillows for support and latching infant quickly with off sided latch technique.   Mother reports that [facility] phoned her yesterday and told her that she had to be a Consulting civil engineer or working to get a pump. Mother reports that with her first child she was advised to pump because infant had wt loss .  Mother is a P4 with the youngest being 78 yrs old. WIC referral was sent to request a pump . Mother only has a hand pump but prefers a DEBP.  Mother receptive to all teaching. Mother is a P, infant is hours old and is  Mother to continue to cue base feed infant and feed at least 8-12 times or more in 24 hours and advised to allow for cluster feeding infant as needed.  Mother to continue to due STS. Mother is aware of available LC services at [facility], [facility], OP Dept, and phone # for questions or concerns about breastfeeding.  Mother receptive to all teaching and plan of care.    Maternal Data    Feeding Feeding Type: Breast Fed  LATCH Score                   Interventions    Lactation Tools Discussed/Used     Consult Status      Katie Mayer 08/22/2020, 8:49 AM

## 2021-12-11 ENCOUNTER — Encounter (HOSPITAL_COMMUNITY): Payer: Self-pay

## 2021-12-11 ENCOUNTER — Emergency Department (HOSPITAL_COMMUNITY)
Admission: EM | Admit: 2021-12-11 | Discharge: 2021-12-11 | Disposition: A | Discharge status: Home / Self Care | Payer: Medicaid Other | Attending: Emergency Medicine | Admitting: Emergency Medicine

## 2021-12-11 ENCOUNTER — Other Ambulatory Visit: Payer: Self-pay

## 2021-12-11 DIAGNOSIS — K529 Noninfective gastroenteritis and colitis, unspecified: Secondary | ICD-10-CM | POA: Diagnosis not present

## 2021-12-11 DIAGNOSIS — R112 Nausea with vomiting, unspecified: Secondary | ICD-10-CM | POA: Diagnosis present

## 2021-12-11 LAB — CBC WITH DIFFERENTIAL/PLATELET
Abs Immature Granulocytes: 0 10*3/uL (ref 0.00–0.07)
Basophils Absolute: 0.1 10*3/uL (ref 0.0–0.1)
Basophils Relative: 1 %
Eosinophils Absolute: 0.2 10*3/uL (ref 0.0–0.5)
Eosinophils Relative: 3 %
HCT: 39.9 % (ref 36.0–46.0)
Hemoglobin: 13.1 g/dL (ref 12.0–15.0)
Lymphocytes Relative: 17 %
Lymphs Abs: 1.2 10*3/uL (ref 0.7–4.0)
MCH: 26.4 pg (ref 26.0–34.0)
MCHC: 32.8 g/dL (ref 30.0–36.0)
MCV: 80.3 fL (ref 80.0–100.0)
Monocytes Absolute: 0.3 10*3/uL (ref 0.1–1.0)
Monocytes Relative: 4 %
Neutro Abs: 5.2 10*3/uL (ref 1.7–7.7)
Neutrophils Relative %: 75 %
Platelets: 291 10*3/uL (ref 150–400)
RBC: 4.97 MIL/uL (ref 3.87–5.11)
RDW: 13.2 % (ref 11.5–15.5)
WBC: 6.9 10*3/uL (ref 4.0–10.5)
nRBC: 0 % (ref 0.0–0.2)
nRBC: 0 /100 WBC

## 2021-12-11 LAB — COMPREHENSIVE METABOLIC PANEL
ALT: 14 U/L (ref 0–44)
AST: 18 U/L (ref 15–41)
Albumin: 3.9 g/dL (ref 3.5–5.0)
Alkaline Phosphatase: 70 U/L (ref 38–126)
Anion gap: 8 (ref 5–15)
BUN: 13 mg/dL (ref 6–20)
CO2: 23 mmol/L (ref 22–32)
Calcium: 9.1 mg/dL (ref 8.9–10.3)
Chloride: 105 mmol/L (ref 98–111)
Creatinine, Ser: 0.8 mg/dL (ref 0.44–1.00)
GFR, Estimated: >60 mL/min (ref >60)
Glucose, Bld: 104 mg/dL — ABNORMAL HIGH (ref 70–99)
Potassium: 3.9 mmol/L (ref 3.5–5.1)
Sodium: 136 mmol/L (ref 135–145)
Total Bilirubin: 0.4 mg/dL (ref 0.3–1.2)
Total Protein: 7.7 g/dL (ref 6.5–8.1)

## 2021-12-11 LAB — URINALYSIS, ROUTINE W REFLEX MICROSCOPIC
Bacteria, UA: NONE SEEN
Bilirubin Urine: NEGATIVE
Glucose, UA: NEGATIVE mg/dL
Hgb urine dipstick: NEGATIVE
Ketones, ur: NEGATIVE mg/dL
Leukocytes,Ua: NEGATIVE
Nitrite: NEGATIVE
Protein, ur: 30 mg/dL — AB
Specific Gravity, Urine: 1.021 (ref 1.005–1.030)
pH: 5 (ref 5.0–8.0)

## 2021-12-11 LAB — I-STAT BETA HCG BLOOD, ED (MC, WL, AP ONLY): I-stat hCG, quantitative: <5 m[IU]/mL (ref <5)

## 2021-12-11 LAB — LIPASE, BLOOD: Lipase: 36 U/L (ref 11–51)

## 2021-12-11 MED ORDER — ONDANSETRON 4 MG PO TBDP: 4.0000 mg | ORAL_TABLET | Freq: Once | ORAL | Status: DC

## 2021-12-11 MED ORDER — ONDANSETRON 4 MG PO TBDP: 4.0000 mg | ORAL_TABLET | Freq: Three times a day (TID) | ORAL | 0 refills | Status: DC | PRN

## 2021-12-11 MED ORDER — LACTATED RINGERS IV BOLUS: 1000.0000 mL | Freq: Once | INTRAVENOUS | Status: DC

## 2021-12-11 MED ORDER — ONDANSETRON 4 MG PO TBDP
4.0000 mg | ORAL_TABLET | Freq: Once | ORAL | Status: AC
  Administered 2021-12-11: 4 mg via ORAL
  Filled 2021-12-11: qty 1

## 2021-12-11 MED ORDER — ONDANSETRON HCL 4 MG/2ML IJ SOLN: 4.0000 mg | Freq: Once | INTRAMUSCULAR | Status: DC

## 2021-12-11 NOTE — ED Provider Notes (Signed)
?[facility] EMERGENCY DEPARTMENT ?Provider Note ? ? ?CSN: [id number] ?Arrival date & time: 12/11/21  0456 ? ?  ? ?History ? ?Chief Complaint  ?Patient presents with  ? Emesis  ? ? ?Katie Mayer is a 31 y.o. female. ? ? ?Emesis ? ?  31 year old female presenting with nausea vomiting and diarrhea for one day. She was fasting yesterday but currently no longer. Has had four episodes of NBNB emesis and two episodes of watery diarrhea. Her son had emesis yesterday as well and is a sick contact. No suspect food intact. Had previously complained of epigastric pain on arrival which has since resolved. No fevers or chills. No dysuria or flank pain.  ? ?Home Medications ?Prior to Admission medications   ?Medication Sig Start Date End Date Taking? Authorizing Provider  ?ondansetron (ZOFRAN-ODT) 4 MG disintegrating tablet Take 1 tablet (4 mg total) by mouth every 8 (eight) hours as needed for nausea or vomiting. 12/11/21  Yes Ernie Avena, MD  ?acetaminophen (TYLENOL) 325 MG tablet Take 2 tablets (650 mg total) by mouth every 6 (six) hours as needed for mild pain, moderate pain, fever or headache. 08/22/20   Sharyon Cable, CNM  ?coconut oil OIL Apply 1 application topically as needed (nipple pain). 08/22/20   Sharyon Cable, CNM  ?ibuprofen (ADVIL) 600 MG tablet Take 1 tablet (600 mg total) by mouth every 8 (eight) hours as needed for moderate pain or cramping. 08/22/20   Sharyon Cable, CNM  ?Prenatal Vit-Fe Fumarate-FA (MULTIVITAMIN-PRENATAL) 27-0.8 MG TABS tablet Take 1 tablet by mouth daily at 12 noon. 04/01/17   Marylene Land, CNM  ?   ? ?Allergies    ?Patient has no known allergies.   ? ?Review of Systems   ?Review of Systems  ?Gastrointestinal:  Positive for vomiting.  ?All other systems reviewed and are negative. ? ?Physical Exam ?Updated Vital Signs ?BP 112/81   Pulse 71   Temp 98.3 ?F (36.8 ?C) (Oral)   Resp 16   Ht 5\' 8"  (1.727 m)   Wt 91 kg   LMP 12/04/2021   SpO2  98%   BMI 30.50 kg/m?  ?Physical Exam ?Vitals and nursing note reviewed.  ?Constitutional:   ?   General: She is not in acute distress. ?   Appearance: She is well-developed.  ?HENT:  ?   Head: Normocephalic and atraumatic.  ?   Mouth/Throat:  ?   Comments: Mucous membranes moist ?Eyes:  ?   Conjunctiva/sclera: Conjunctivae normal.  ?Cardiovascular:  ?   Rate and Rhythm: Normal rate and regular rhythm.  ?Pulmonary:  ?   Effort: Pulmonary effort is normal. No respiratory distress.  ?Abdominal:  ?   Palpations: Abdomen is soft.  ?   Tenderness: There is no abdominal tenderness.  ?   Comments: Abdomen soft, nontender, nondistended, negative Murphy sign  ?Musculoskeletal:     ?   General: No swelling.  ?   Cervical back: Neck supple.  ?Skin: ?   General: Skin is warm and dry.  ?   Capillary Refill: Capillary refill takes less than 2 seconds.  ?Neurological:  ?   Mental Status: She is alert.  ?Psychiatric:     ?   Mood and Affect: Mood normal.  ? ? ?ED Results / Procedures / Treatments   ?Labs ?(all labs ordered are listed, but only abnormal results are displayed) ?Labs Reviewed  ?COMPREHENSIVE METABOLIC PANEL - Abnormal; Notable for the following components:  ?  Result Value  ? Glucose, Bld 104 (*)   ? All other components within normal limits  ?URINALYSIS, ROUTINE W REFLEX MICROSCOPIC - Abnormal; Notable for the following components:  ? APPearance HAZY (*)   ? Protein, ur 30 (*)   ? All other components within normal limits  ?CBC WITH DIFFERENTIAL/PLATELET  ?LIPASE, BLOOD  ?I-STAT BETA HCG BLOOD, ED ([facility], [facility], [facility] ONLY)  ? ? ?EKG ?None ? ?Radiology ?No results found. ? ?Procedures ?Procedures  ? ? ?Medications Ordered in ED ?Medications  ?ondansetron (ZOFRAN-ODT) disintegrating tablet 4 mg (4 mg Oral Given 12/11/21 0953)  ? ? ?ED Course/ Medical Decision Making/ A&P ?  ?                        ?Medical Decision Making ?Risk ?Prescription drug management. ? ? ?31 year old female presenting with nausea vomiting and  diarrhea for one day. She was fasting yesterday but currently no longer. Has had four episodes of NBNB emesis and two episodes of watery diarrhea. Her son had emesis yesterday as well and is a sick contact. No suspect food intact. Had previously complained of epigastric pain on arrival which has since resolved. No fevers or chills. No dysuria or flank pain.  ? ?On arrival, the patient was afebrile, hemodynamically stable, saturating well on room air.  Sinus rhythm noted on cardiac telemetry.  Physical exam significant for an overall well-appearing but mildly fatigued female in no apparent distress, moist mucous membranes, abdomen that is soft, nondistended, nontender to palpation.  ? ?Patient presenting with roughly 12 hours of nausea, vomiting, diarrhea.  She had some epigastric abdominal discomfort post emesis which is since resolved.  Her emesis has been nonbloody and nonbilious.  She does have a sick contact in her son who also had episodes of emesis.  No suspect food intake.  Overall well-appearing and able to orally rehydrate after Zofran ODT.   ? ?Laboratory work-up significant for beta-hCG negative, lipase normal, CMP without electrolyte abnormality, normal renal and liver function, CBC without a leukocytosis or anemia, urinalysis without evidence of UTI. ? ?At this time, symptoms are most consistent with gastroenteritis.  Low suspicion for other acute intra-abdominal abnormality such as appendicitis, diverticulitis, ischemic colitis, cholecystitis, pancreatitis, pyelonephritis/UTI based on history, physical exam and laboratory work-up.  Based on the patient's reassuring abdominal exam, do not think CT imaging of the abdomen is warranted at this time. ? ?Encouraged continued oral rehydration with electrolyte containing fluids, Zofran as needed for nausea, return precautions in the event of worsening abdominal discomfort or symptoms of dehydration. ? ?Final Clinical Impression(s) / ED Diagnoses ?Final  diagnoses:  ?Gastroenteritis  ? ? ?Rx / DC Orders ?ED Discharge Orders   ? ?      Ordered  ?  ondansetron (ZOFRAN-ODT) 4 MG disintegrating tablet  Every 8 hours PRN       ? 12/11/21 0951  ? ?  ?  ? ?  ? ? ?  ?Ernie Avena, MD ?12/11/21 1556 ? ?

## 2021-12-11 NOTE — ED Notes (Signed)
Pt was able to tolerate PO fluids and saltine crackers without feeling nauseous. She reports feeling good and ready to go home ?

## 2021-12-11 NOTE — ED Provider Triage Note (Signed)
Emergency Medicine Provider Triage Evaluation Note ? ?Katie Mayer , a 31 y.o. female  was evaluated in triage.  Pt complains of epigastric pain, nausea, and diarrhea.  Patient states that she has been fasting during the day, and ate some porridge and a cookie to break her fast.  She states that she thinks this may have upset her stomach as she normally just eats shrimp to break her fast.  She complains of epigastric pain, multiple episodes of nonbloody emesis, and watery diarrhea.  No known fevers or chills. ? ?Review of Systems  ?Positive: As above  ?Negative: As above  ? ?Physical Exam  ?There were no vitals taken for this visit. ?Gen:   Awake, no distress   ?Resp:  Normal effort  ?MSK:   Moves extremities without difficulty  ?Other:  Abdomen soft, nontender ? ?Medical Decision Making  ?Medically screening exam initiated at 5:04 AM.  Appropriate orders placed.  Katie Mayer was informed that the remainder of the evaluation will be completed by another provider, this initial triage assessment does not replace that evaluation, and the importance of remaining in the ED until their evaluation is complete. ? ?  ?Mare Ferrari, PA-C ?12/11/21 6503 ? ?

## 2021-12-11 NOTE — Discharge Instructions (Addendum)
You were evaluated in the Emergency Department and after careful evaluation, we did not find any emergent condition requiring admission or further testing in the hospital. ? ?Your exam/testing today was overall reassuring.  Your abdominal exam was reassuring as was your laboratory work-up.  Recommend Zofran for nausea, continue to push fluids at home. ? ?Please return to the Emergency Department if you experience any worsening of your condition.  Thank you for allowing [place] to be a part of your care. ? ?

## 2021-12-11 NOTE — ED Triage Notes (Signed)
Pt with N/V/D for 1 day. Denies any aggravating foods. Pt with generalized abdominal pain. ?

## 2021-12-11 NOTE — ED Notes (Signed)
Pt reports that she experienced nausea that woke her up out of bed and she went to the bathroom and vomited. She is currently observing fast and can't eat or drink anything for the month until after sun down. She has never experienced this before when fasting. She also explains she had diarrhea episode without realizing it this AM. She has c/o of low back pain and pain in her abd "all over"   ?

## 2022-03-11 ENCOUNTER — Telehealth: Payer: Medicaid Other | Admitting: Physician Assistant

## 2022-03-11 DIAGNOSIS — B3731 Acute candidiasis of vulva and vagina: Secondary | ICD-10-CM | POA: Diagnosis not present

## 2022-03-11 MED ORDER — FLUCONAZOLE 150 MG PO TABS: 150.0000 mg | ORAL_TABLET | ORAL | 0 refills | Status: DC | PRN

## 2022-03-11 NOTE — Progress Notes (Signed)

## 2022-03-15 DIAGNOSIS — Z1231 Encounter for screening mammogram for malignant neoplasm of breast: Secondary | ICD-10-CM

## 2022-03-26 ENCOUNTER — Telehealth: Payer: Medicaid Other | Admitting: Physician Assistant

## 2022-03-26 DIAGNOSIS — B9689 Other specified bacterial agents as the cause of diseases classified elsewhere: Secondary | ICD-10-CM

## 2022-03-26 DIAGNOSIS — R079 Chest pain, unspecified: Secondary | ICD-10-CM

## 2022-03-26 NOTE — Progress Notes (Signed)
Because of chest pain along with your URI symptoms and need to rule out pneumonia, etc., I feel your condition warrants further evaluation and I recommend that you be seen in a face to face visit.   NOTE: There will be NO CHARGE for this eVisit   If you are having a true medical emergency please call 911.      For an urgent face to face visit, [facility] has seven urgent care centers for your convenience:     [facility] Center at [facility] Directions [phone] [address] [place], [place] [address]    [facility] Health Urgent Care Center [facility]) Get Driving Directions [phone] [address] [place], [place] [address]  [facility] Health Urgent Care Center [facility]) Get Driving Directions [phone] [address] [place],  [place]  [address]  [facility] Health Urgent Care Center [facility] - at [employer] Directions  [phone] [address] [place],  [place] [address]   [facility] Health Urgent Care at [facility] Get Driving Directions [phone] [address] [place], [place] [address]   [facility] Health Urgent Care at [facility] Get Driving Directions  [phone] [address] [place], [place] [address]   [facility] Health Urgent Care at [facility] Directions [phone] [address] [place], [place] [address]  Your MyChart E-visit questionnaire answers were reviewed by a board certified advanced clinical practitioner to complete your personal care plan based on your specific symptoms.  Thank you for using e-Visits.

## 2022-11-14 ENCOUNTER — Other Ambulatory Visit: Payer: Self-pay

## 2022-11-14 ENCOUNTER — Ambulatory Visit: Payer: Medicaid Other | Admitting: Obstetrics and Gynecology

## 2022-11-14 ENCOUNTER — Other Ambulatory Visit (HOSPITAL_COMMUNITY)
Admission: RE | Admit: 2022-11-14 | Discharge: 2022-11-14 | Disposition: A | Discharge status: Home / Self Care | Payer: Medicaid Other | Source: Ambulatory Visit | Attending: Obstetrics and Gynecology | Admitting: Obstetrics and Gynecology

## 2022-11-14 ENCOUNTER — Encounter: Payer: Self-pay | Admitting: Obstetrics and Gynecology

## 2022-11-14 ENCOUNTER — Telehealth: Payer: Self-pay | Admitting: General Practice

## 2022-11-14 VITALS — BP 111/75 | HR 71 | Ht 68.0 in | Wt 207.0 lb

## 2022-11-14 DIAGNOSIS — Z124 Encounter for screening for malignant neoplasm of cervix: Secondary | ICD-10-CM | POA: Diagnosis not present

## 2022-11-14 DIAGNOSIS — Z538 Procedure and treatment not carried out for other reasons: Secondary | ICD-10-CM

## 2022-11-14 DIAGNOSIS — Z975 Presence of (intrauterine) contraceptive device: Secondary | ICD-10-CM

## 2022-11-14 NOTE — Telephone Encounter (Signed)
Left message on VM for pt to contact our office to schedule hysteroscopic IUD removal in clinic per Dr. Currie Paris.

## 2022-11-14 NOTE — Telephone Encounter (Signed)
-----   Message from Darliss Cheney, MD sent at 11/14/2022 12:08 PM EDT ----- Regarding: hysteroscopy Hello,  Can we schedule this patient for a hysteroscopic IUD removal in clinic?  Thank you,  Marchia Bond

## 2022-11-14 NOTE — Progress Notes (Signed)
NEW GYNECOLOGY PATIENT Patient name: Katie Mayer MRN [id number]  Date of birth: [birth year] Chief Complaint:   New Patient (Initial Visit) and Contraception (Wants IUD removed, desires children)     History:  Katie Mayer is a 32 y.o. G3P3003 being seen today for IUD removal. Had Liletta IUD placed postplacentally 2022. Went to an outside clinic, they were not able to visualize the strings and sent here for removal. Has irregular menses with IUD in place. She is ready to conceive again.      Gynecologic History No LMP recorded. (Menstrual status: IUD). Contraception: IUD Last Pap: 2021, NILM. Denies prior abnormal  Last Mammogram: n/a Last Colonoscopy: n/a  Obstetric History OB History  Gravida Para Term Preterm AB Living  3 3 3     3   SAB IAB Ectopic Multiple Live Births        0 3    # Outcome Date GA Lbr Len/2nd Weight Sex Delivery Anes PTL Lv  3 Term 08/20/20 [redacted]w[redacted]d 11:09 / 00:15 6 lb 12.5 oz (3.075 kg) F Vag-Spont EPI  LIV  2 Term 05/16/17 [redacted]w[redacted]d 03:37 / 00:06 6 lb 2.6 oz (2.795 kg) M Vag-Spont EPI  LIV  1 Term 01/20/16 [redacted]w[redacted]d / 00:49 6 lb 15.5 oz (3.161 kg) M Vag-Spont EPI  LIV    Past Medical History:  Diagnosis Date   Anemia    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     Cholecalciferol (VITAMIN D3 GUMMIES ADULT PO) Take by mouth.     coconut oil OIL Apply 1 application topically as needed (nipple pain).  0   ibuprofen (ADVIL) 600 MG tablet Take 1 tablet (600 mg total) by mouth every 8 (eight) hours as needed for moderate pain or cramping. 30 tablet 0   Prenatal Vit-Fe Fumarate-FA  (MULTIVITAMIN-PRENATAL) 27-0.8 MG TABS tablet Take 1 tablet by mouth daily at 12 noon. (Patient not taking: Reported on 11/14/2022) 30 each 3   No current facility-administered medications on file prior to visit.    No Known Allergies  Social History:  reports that she has never smoked. She has never used smokeless tobacco. She reports that she does not drink alcohol and does not use drugs.  Family History  Problem Relation Age of Onset   Hypertension Mother    Hypertension Father     The following portions of the patient's history were reviewed and updated as appropriate: allergies, current medications, past family history, past medical history, past social history, past surgical history and problem list.  Review of Systems Pertinent items noted in HPI and remainder of comprehensive ROS otherwise negative.  Physical Exam:  BP 111/75   Pulse 71   Ht 5\' 8"  (1.727 m)   Wt 207 lb (93.9 kg)   BMI 31.47 kg/m  Physical Exam Vitals and nursing note reviewed. Exam conducted with a chaperone present.  Constitutional:      Appearance: Normal appearance.  Cardiovascular:     Rate and Rhythm: Normal rate.  Pulmonary:     Effort: Pulmonary effort is normal.     Breath sounds:  Normal breath sounds.  Genitourinary:    General: Normal vulva.     Exam position: Lithotomy position.     Cervix: Normal.     Comments: IUD strings not visualized Neurological:     General: No focal deficit present.     Mental Status: She is alert and oriented to person, place, and time.  Psychiatric:        Mood and Affect: Mood normal.        Behavior: Behavior normal.        Thought Content: Thought content normal.        Judgment: Judgment normal.    IUD Removal  Patient identified, informed consent performed, consent signed.  Patient was in the dorsal lithotomy position, normal external genitalia was noted.  A speculum was placed in the patient's vagina, normal discharge was noted, no lesions. The  cervix was visualized, no lesions, no abnormal discharge.  The strings of the IUD were not visualized and unable to be retrieved using forceps and IUD hook. Patient tolerated the procedure.  Bedside US shows IUD within endometrial cavity. Patient declined further attempts.        Assessment and Plan:   1. Screening for cervical cancer Routine pap collected - Cytology - PAP  2. Attempted IUD removal, unsuccessful Attempted removal of IUD, unsuccessful in clinic and patient uncomfortable during attempted removal. Offered removal via hysteroscopy in office and in the OR. Patient elects for office hysteroscopy - reached out to [facility] location to have follow up with me for hysteroscopic removal. Will give pre-procedure cytotec for cervical preparation and premedicate with NSAIDs.    Routine preventative health maintenance measures emphasized. Please refer to After Visit Summary for other counseling recommendations.   Follow-up: Return for  GYN Follow Up [facility] for OH.      Darliss Cheney, MD Obstetrician & Gynecologist, Faculty Practice Minimally Invasive Gynecologic Surgery Center for [employer], [place]

## 2022-11-18 LAB — CYTOLOGY - PAP
Comment: NEGATIVE
Diagnosis: NEGATIVE
High risk HPV: NEGATIVE

## 2023-01-06 ENCOUNTER — Ambulatory Visit (INDEPENDENT_AMBULATORY_CARE_PROVIDER_SITE_OTHER): Payer: Medicaid Other | Admitting: Obstetrics and Gynecology

## 2023-01-06 ENCOUNTER — Encounter: Payer: Self-pay | Admitting: Obstetrics and Gynecology

## 2023-01-06 ENCOUNTER — Ambulatory Visit: Payer: Medicaid Other | Admitting: Obstetrics and Gynecology

## 2023-01-06 VITALS — BP 123/50 | HR 81 | Ht 66.0 in | Wt 198.0 lb

## 2023-01-06 DIAGNOSIS — Z30432 Encounter for removal of intrauterine contraceptive device: Secondary | ICD-10-CM

## 2023-01-06 DIAGNOSIS — T8332XD Displacement of intrauterine contraceptive device, subsequent encounter: Secondary | ICD-10-CM | POA: Diagnosis not present

## 2023-01-06 MED ORDER — KETOROLAC TROMETHAMINE 30 MG/ML IJ SOLN
30.0000 mg | Freq: Once | INTRAMUSCULAR | Status: AC
  Administered 2023-01-06: 30 mg via INTRAMUSCULAR

## 2023-01-06 NOTE — Progress Notes (Signed)
Patient present for hysteroscopy IUD removal. Armandina Stammer RN

## 2023-01-06 NOTE — Progress Notes (Signed)
    GYNECOLOGY VISIT  Patient name: Katie Mayer MRN [id number]  Date of birth: 1991-04-22 Chief Complaint:   Procedure  History:  Katie Mayer is a 32 y.o. G3P3003 being seen today for office hysteroscopic IUD removal. Sure she would like to have IUD removed and attempt to conceive. Will do pills for contraception next.    The following portions of the patient's history were reviewed and updated as appropriate: allergies, current medications, past family history, past medical history, past social history, past surgical history and problem list.    Review of Systems:  Pertinent items are noted in HPI. Comprehensive review of systems was otherwise negative.   Objective:  Physical Exam BP (!) 123/50   Pulse 81   Ht 5\' 6"  (1.676 m)   Wt 198 lb (89.8 kg)   LMP 12/23/2022 (Within Days)   BMI 31.96 kg/m    INDICATIONS: 32 y.o. [name]  here for office hysteroscopy for retained IUD.  Risks of surgery were discussed with the patient including but not limited to: bleeding which may require transfusion; infection which may require antibiotics; injury to uterus or surrounding organs; intrauterine scarring which may impair future fertility; need for additional procedures including laparotomy or laparoscopy; and other postoperative/anesthesia complications. Written informed consent was obtained.    PROCEDURE: The patient was taken to the procedure room where she received Toradol 10 mg IM.  After an adequate timeout was performed, she was placed in the dorsal lithotomy position.  A speculum was then placed in the patient's vagina.  A paracervical block of 10cc of 2% lidocaine with epinephrine was placed with 5cc at both 4 and 8 o'clock.  A single tooth tenaculum was applied to the anterior lip of the cervix.   A 5mm hysteroscope was inserted under direct visualization using normal saline as a distending medium.  The uterine cavity was carefully examined, and the IUD visualized. The IUD  was grasped with forceps and removed intact..  The tenaculum was removed from the anterior lip of the cervix  and there was bleeding noted from the tenaculum sites. Silver nitrate was applied and there was continued bleeding; monsels then applied and hemostasis was achieved. The vaginal speculum was removed after noting good hemostasis.  The patient tolerated the procedure well.   There were no immediate complications.      Assessment & Plan:   1. Encounter for IUD removal Now s/p uncomplicated hysteroscopic IUD removal. Patient does not need contraception as she is going to attempt to conceive.   Lorriane Shire, MD Minimally Invasive Gynecologic Surgery Center for [facility], [facility]

## 2023-01-30 ENCOUNTER — Ambulatory Visit: Payer: Medicaid Other | Admitting: Obstetrics and Gynecology

## 2023-07-10 LAB — PREGNANCY, URINE: Preg Test, Ur: POSITIVE

## 2023-08-20 NOTE — L&D Delivery Note (Signed)
 Labor Progress Katie Mayer is a 33 y.o. female [phone] with IUP at [redacted]w[redacted]d admitted for IOL for DFM .  She progressed  with Pitocin  only for augmentation and had variable decelerations requiring amnioinfusion. Decelerations continued, and Pitocin  was turned off.  Pt continued to progress to complete and pushed x 2 contractions to deliver infant with nuchal cord x 2 and body cord. Cord clamping delayed by 1 minute then clamped by CNM and cut by patient as her husband had gone home and was on his way back to the hospital.  Placenta intact and spontaneous, bleeding minimal. Intact perineum without repair. Mom and baby stable prior to transfer to postpartum. She plans on breastfeeding. She requests OP Mirena  IUD for birth control.  Delivery Note At 2:55 AM a viable and healthy female was delivered via Vaginal, Spontaneous (Presentation: Middle Occiput Anterior).  APGAR: 9, 9; weight  pending.   Placenta status: Spontaneous, Intact.  Cord: 3 vessels with the following complications: None.    Anesthesia: Epidural Episiotomy: None Lacerations: None Suture Repair: n/a Est. Blood Loss (mL): 26  Mom to postpartum.  Baby to Couplet care / Skin to Skin.  [employer] 03/06/2024, 3:31 AM

## 2023-08-28 ENCOUNTER — Encounter: Payer: Self-pay | Admitting: *Deleted

## 2023-09-04 ENCOUNTER — Telehealth: Payer: Self-pay

## 2023-09-04 DIAGNOSIS — Z3482 Encounter for supervision of other normal pregnancy, second trimester: Secondary | ICD-10-CM

## 2023-09-04 DIAGNOSIS — Z3480 Encounter for supervision of other normal pregnancy, unspecified trimester: Secondary | ICD-10-CM | POA: Insufficient documentation

## 2023-09-04 DIAGNOSIS — Z3A14 14 weeks gestation of pregnancy: Secondary | ICD-10-CM

## 2023-09-04 NOTE — Progress Notes (Signed)
New OB Intake  I connected with Katie Mayer  on 09/04/23 at  8:15 AM EST by MyChart Video Visit and verified that I am speaking with the correct person using two identifiers. Nurse is located at [facility] and pt is located at home.  I discussed the limitations, risks, security and privacy concerns of performing an evaluation and management service by telephone and the availability of in person appointments. I also discussed with the patient that there may be a patient responsible charge related to this service. The patient expressed understanding and agreed to proceed.  I explained I am completing New OB Intake today. We discussed EDD of 03/03/2024, by Last Menstrual Period. Pt is G4P3003. I reviewed her allergies, medications and Medical/Surgical/OB history.    Patient Active Problem List   Diagnosis Date Noted   Supervision of other normal pregnancy, antepartum 09/04/2023   Hemoglobin S trait (HCC) 12/05/2016    Concerns addressed today  Delivery Plans Plans to deliver at [facility]. Discussed the nature of our practice with multiple providers including residents and students. Due to the size of the practice, the delivering provider may not be the same as those providing prenatal care.   Patient is not interested in water birth. Offered upcoming OB visit with CNM to discuss further.  MyChart/Babyscripts MyChart access verified. I explained pt will have some visits in office and some virtually. Babyscripts instructions given and order placed. Patient verifies receipt of registration text/e-mail. Account successfully created and app downloaded. If patient is a candidate for Optimized scheduling, add to sticky note.   Blood Pressure Cuff/Weight Scale Will order patient a blood pressure device once Medicaid approved.   Anatomy US Explained first scheduled [place] will be around 19 weeks. Anatomy US scheduled for 10/08/2023 at 9:15AM.  Is patient a CenteringPregnancy candidate?   Declined Declined due to Childcare    Is patient a Mom+Baby Combined Care candidate?  Not a candidate   If accepted, confirm patient does not intend to move from the area for at least 12 months, then notify Mom+Baby staff  Interested in [place]? If yes, send referral and doula dot phrase.   Is patient a candidate for Babyscripts Optimization? Yes, patient declined   First visit review I reviewed new OB appt with patient. Explained pt will be seen by Shelbie Proctor. Shawnie Pons MD at first visit. Discussed Avelina Laine genetic screening with patient. YES Panorama and Horizon.. Routine prenatal labs is not   Last Pap Diagnosis  Date Value Ref Range Status  11/14/2022   Final   - Negative for intraepithelial lesion or malignancy (NILM)    Vidal Schwalbe, CMA 09/04/2023  8:47 AM

## 2023-09-12 ENCOUNTER — Encounter: Payer: Self-pay | Admitting: Family Medicine

## 2023-09-12 ENCOUNTER — Other Ambulatory Visit: Payer: Self-pay

## 2023-09-12 ENCOUNTER — Ambulatory Visit (INDEPENDENT_AMBULATORY_CARE_PROVIDER_SITE_OTHER): Payer: Medicaid Other | Admitting: Family Medicine

## 2023-09-12 ENCOUNTER — Other Ambulatory Visit (HOSPITAL_COMMUNITY)
Admission: RE | Admit: 2023-09-12 | Discharge: 2023-09-12 | Disposition: A | Discharge status: Home / Self Care | Payer: Medicaid Other | Source: Ambulatory Visit | Attending: Family Medicine | Admitting: Family Medicine

## 2023-09-12 VITALS — BP 114/83 | HR 82 | Wt 211.3 lb

## 2023-09-12 DIAGNOSIS — Z23 Encounter for immunization: Secondary | ICD-10-CM | POA: Diagnosis not present

## 2023-09-12 DIAGNOSIS — K117 Disturbances of salivary secretion: Secondary | ICD-10-CM

## 2023-09-12 DIAGNOSIS — Z3A15 15 weeks gestation of pregnancy: Secondary | ICD-10-CM | POA: Diagnosis not present

## 2023-09-12 DIAGNOSIS — O219 Vomiting of pregnancy, unspecified: Secondary | ICD-10-CM

## 2023-09-12 DIAGNOSIS — Z1332 Encounter for screening for maternal depression: Secondary | ICD-10-CM

## 2023-09-12 DIAGNOSIS — Z3480 Encounter for supervision of other normal pregnancy, unspecified trimester: Secondary | ICD-10-CM | POA: Insufficient documentation

## 2023-09-12 DIAGNOSIS — D573 Sickle-cell trait: Secondary | ICD-10-CM | POA: Diagnosis not present

## 2023-09-12 MED ORDER — PRENATAL 27-0.8 MG PO TABS: 1.0000 | ORAL_TABLET | Freq: Every day | ORAL | 3 refills | Status: DC

## 2023-09-12 MED ORDER — PROMETHAZINE HCL 25 MG PO TABS: 12.5000 mg | ORAL_TABLET | Freq: Four times a day (QID) | ORAL | 1 refills | Status: DC | PRN

## 2023-09-12 MED ORDER — GLYCOPYRROLATE 2 MG PO TABS: 2.0000 mg | ORAL_TABLET | Freq: Three times a day (TID) | ORAL | 3 refills | Status: DC

## 2023-09-12 NOTE — Patient Instructions (Signed)
[facility] Providers  [place] ([id number]) [facility] Manson Passey, MD; Deirdre Priest, MD; Lum Babe, MD; Leveda Anna, MD; McDiarmid, MD; Jerene Bears, MD [address] [place] [place], [place] [address] [phone] Mon-Fri 8:30-12:30, 1:30-5:00  Providers come to see babies during newborn hospitalization Only accepting infants of Mother's who are seen at [facility] or have siblings seen at   [facility] Medicaid - Yes; Tricare - Yes   [facility] [place], MD [address] [place], [place] [address] [phone] Mon, Tue, Thur, Fri 8:30-5:00, Wed 10:00-7:00 (closed 1-2pm daily for lunch) [facility] residents with no insurance.  [place] [employer] only with Medicaid/insurance; Tricare - no  [facility]) - Tim [facility], MD; Manson Passey, MD; Ave Filter, MD; Luna Fuse, MD; Kennedy Bucker, MD; Florestine Avers, MD; Melchor Amour, MD; Yetta Barre,  MD; Konrad Dolores, MD; Kathlene November, MD; Jenne Campus, MD; Wynetta Emery, MD; Duffy Rhody, MD; Gerre Couch, NP [address] [place], [place], [place] [address] [phone] Mon, Tue, Thur, Fri 8:30-5:30, Wed 9:30-5:30, Sat 8:30-12:30 Only accepting infants of first-time parents or siblings of current patients Hospital discharge coordinator will make follow-up appointment Medicaid - yes; Tricare - yes  [place] [phone]) [facility]an China, MD; Earlene Plater, MD; Jamesetta Orleans, MD; Alvera Novel, MD; Rana Snare, MD; [facility], MD; Shaaron Adler, MD; Hosie Poisson, MD; Mayford Knife, MD [address], [place], [place] [address] [phone] Mon-Fri 8:30-5:00, closed for lunch 12:30-1:30; Sat-Sun 10:00-1:00 Accepting Newborns with commercial insurance only, must call prior to delivery to be accepted into  practice.  Medicaid - no, Tricare - yes   [facility] [address] [place], [place] [address] [phone] or [phone] Mon to Fri 8am to 10pm, Sat 8am to 1pm  (virtual only on weekends) Only accepts Medicaid Healthy Blue pts  [facility] - Pediatrics at Elige Radon, MD; Sabino Dick, MD; Quitman Livings, MD; Betha Loa, NP; Claretha Cooper, MD; Lelon Perla, MD [address] [place] [place], [place] [address] [phone] Mon-Fri 8:30-5:30 Medicaid - yes, Tricare - yes  [place] [phone]) [facility] Marcie Mowers, MD [address], [place], [place] [address] [phone] Iona Hansen, Wed Fri 8:30-5:00, Sat 8:30-12:00, Closed Thursdays Accepting siblings of established patients and first time mom's if you call prenatally Medicaid- yes; Tricare - yes  [facility] Lutricia Feil, [place]; Tracie Harrier, MD; Rusty Aus; [place], PA; Wynelle Link, MD; Azucena Cecil, MD;  [address], [place], [place] [address] [phone] Mon-Fri 8:30-5:00, closed for lunch 1-2 Only accepting newborns of established patients Medicaid- no; Tricare - yes  [facility] [phone]) [facility] Morene Crocker, MD; [address], [place], [place] [address] [phone] Mon-Fri 8:00-5:00 Medicaid - No; Tricare - Yes  [facility], [place]; [place], [place] [address] [place], [place] [address] [phone] Mon-Fri 8:00-5:00 Medicaid - No, Tricare - Yes  [facility] Cardell Peach, MD; Nash Dimmer, MD; [place], [place] [address], [address] [place], [place] [address] [phone]  Mon-Fri 8:00-5:00 Medicaid - No; Tricare - Yes  [facility] [address], [place], [place] [address] [phone] Mon-Fri 8:30-5:00 (lunch 12:00-1:00) Medicaid -Yes; Tricare - Yes  [facility], MD [address] [place], [place], [place] [address] [phone] Mon-Fri 8:00-5:00 Seeing newborns of current patients only. No new patients Medicaid - No, Tricare - yes  Nature conservation officer at [place] [address], MD [address] [place], [place] [address] [phone] Mon-Fri 8:00-5:00 Medicaid -yes as secondary coverage only;  Tricare - yes  [facility] [place], [place]; [place], [place]; Avis Epley, MD; Vonna Kotyk, MD; Clance Boll, MD; [place], [place]; [place], NP; Vaughan Basta, MD; [place], MD [address] [place], [place] [address] [phone] Mon-Fri 8:30-5:00, Sat 9:00-11:00 Accepts commercial insurance ONLY. Offers free prenatal information sessions for families. Medicaid - No, Tricare - Call first  [facility] [place], MD; [place], [place]; [place], [place]; [place], [place] [address] [place] [address] [phone] Mon-Fri 7:30-5:30 Medicaid - Yes; Ailene Rud yes  [place] [phone] & [phone])  [facility], MD [address], [place], [place] [address] [phone] Mon-Thur 8:00-6:00, closed for lunch 12-2, closed Fridays Medicaid - yes; Tricare - no  [facility] Dareen Piano, NP; Cyndia Bent, MD; [place], [place]; [place], [place] [address], [place], [place] [address] [phone] Mon-Fri 7:30-4:30 Medicaid - yes, Tricare - yes  [facility]  [name], MD; Janene Harvey, NP; Vonita Moss, MD; Donn Pierini, NP [address], [place], [place] [address] [phone] Mon-Fri 8:30-5:00, closed for lunch 1-2, Sat 8:30-12:00 - sick visits only Providers come to see babies at [facility] Only accepting newborns of siblings and first time parents ONLY if who have met with office prior to delivery Medicaid -Yes; Tricare - yes  [facility] - [place], [place]; Spero Geralds, NP; Earlene Plater, MD; Lucretia Roers, MD:  [address], [place], [place] [address] [phone] Mon- Fri 8:00-5:00, Sat 9:00-12:00 - sick visits only Accepting siblings of established patients and first time mom/baby Medicaid - Yes; Tricare - yes Patients must have vaccinations (baby vaccines)  [place] [phone] &  [phone])  Adult nurse HealthCare at [facility] [address] [place], [place] [address] [phone] Mon-Fri 8:00-5:00 Medicaid - no; Tricare - yes  [facility] [place], MD; [place], [place]; [place], [place] [address], [place], [place] [address] [phone] Mon-Fri 8:00-5:00 Medicaid- yes; Tricare - yes  [facility] - [place] Jourdain, MD; Yetta Barre, NP; [place], [place] [address] [place], [place], [place] [address] [phone] Mon-Fri 8:00-5:00 Medicaid - Yes; Tricare - yes  [address] [place] [phone])  [facility] [place], [place]; [place], [place]; Eddie Candle, MD; Normand Sloop, MD; [place], NP; [place], DO; [place], [place]; Constance Goltz, MD; Ruthann Cancer, MD; Vear Clock, MD; [place], [place]; [place], [place]; [place], [place] [address] [facility] [address], [place], [place] [address] [phone] Mon-Fri 8:30-5:00, Sat 9:00-12:00 - sick only Please register online [email or web address] then schedule online or call office Medicaid-Yes; Tricare -yes  [facility] - [place]  [place], MD; Romualdo Bolk, MD; [place], MD; [place], NP; [place], [place]; Antonietta Barcelona, MD; [place] Knife, NP; Shelva Majestic, MD [address], [place], [place] [address] [phone] Mon-Fri 8:00-5:30, Sat&Sun by appointment (phones open at 8:30) Medicaid - Yes; Tricare - yes  [place] [phone] & [phone]) [facility] Mariel Aloe; La Pryor, MD; Roger Shelter, MD; Arvilla Market, NP; [place], DO [address], [place], [place] [address] [phone] M-F 8:00 - 5:15, Sat/Sun 9-12 sick visits only Medicaid - No; Tricare - yes  [facility]  [place], PA-C; [place], PA-C; [place], DO; [place], PA-C; [place], PA-C; Roselyn Bering, MD [address] [place], [place] [address] [phone] Mon-Thur 8:00-7:00, Fri 8:00-5:00 Accepting Medicaid for 13 and under only   [facility] - Family Medicine  at [facility] [place] [place], [place]; [place], FNP; Berneda Rose, MD; [place], PA-C; [place],  MD; Kellie Simmering, FNP; Genevie Cheshire, FNP; Evaristo Bury, MD; Berneda Rose, MD [phone] [address], [place], [place] [address] [phone] Mon-Fri 8:30-5:30 Medicaid - Yes; Tricare - yes  [facility] - [address]  [place], [place]; Whitney Post, MD; Hennie Duos, MD; Wynne Dust, MD; [place], NP [address], [place], [place] [address] [phone] Mon-Thur 8:00-5:30, Fri 8:00-5:00, Sat 9:00-12:00 Medicaid - yes, Tricare - yes

## 2023-09-12 NOTE — Progress Notes (Addendum)
Subjective:   Katie Mayer is a 33 y.o. [phone] at [redacted]w[redacted]d by LMP being seen today for her first obstetrical visit.  Her obstetrical history is not significant. Patient does intend to breast feed. Pregnancy history fully reviewed.  Patient reports nausea, vomiting, and spitting .  HISTORY: OB History  Gravida Para Term Preterm AB Living  4 3 3  0 0 3  SAB IAB Ectopic Multiple Live Births  0 0 0 0 3    # Outcome Date GA Lbr Len/2nd Weight Sex Type Anes PTL Lv  4 Current           3 Term 08/20/20 [redacted]w[redacted]d 11:09 / 00:15 6 lb 12.5 oz (3.075 kg) F Vag-Spont EPI  LIV     Name: GINGER, LEETH     Apgar1: 9  Apgar5: 9  2 Term 05/16/17 [redacted]w[redacted]d 03:37 / 00:06 6 lb 2.6 oz (2.795 kg) M Vag-Spont EPI  LIV     Name: LAKRISHA, ISEMAN     Apgar1: 9  Apgar5: 9  1 Term 01/20/16 [redacted]w[redacted]d / 00:49 6 lb 15.5 oz (3.161 kg) M Vag-Spont EPI  LIV     Name: Randel Books SOULEYMANE     Apgar1: 8  Apgar5: 9   Last pap smear was  11/14/2022 and was normal Past Medical History:  Diagnosis Date   Anemia    Hemoglobin S trait (HCC) 12/05/2016   HPV (human papilloma virus) anogenital infection    IUD (intrauterine device) in place 08/20/2020   Post-placental placed 08/20/2020. Due for removal 08/21/2027.      Leakage, amniotic fluid 08/20/2020   Normal labor 08/20/2020   Vaginal delivery 08/20/2020   Past Surgical History:  Procedure Laterality Date   WISDOM TOOTH EXTRACTION     Family History  Problem Relation Age of Onset   Hypertension Mother    Hypertension Father    Diabetes Paternal Grandfather    Social History   Tobacco Use   Smoking status: Never   Smokeless tobacco: Never  Vaping Use   Vaping status: Never Used  Substance Use Topics   Alcohol use: No   Drug use: No   No Known Allergies No current outpatient medications on file prior to visit.   No current facility-administered medications on file prior to visit.     Exam   Vitals:   09/12/23 0940   BP: 114/83  Pulse: 82  Weight: 211 lb 4.8 oz (95.8 kg)   Fetal Heart Rate (bpm): 150  System: General: well-developed, well-nourished female in no acute distress   Skin: normal coloration and turgor, no rashes   Neurologic: oriented, normal, negative, normal mood   Extremities: normal strength, tone, and muscle mass, ROM of all joints is normal   HEENT PERRLA, extraocular movement intact and sclera clear, anicteric   Mouth/Teeth mucous membranes moist, pharynx normal without lesions and dental hygiene good   Neck supple and no masses   Cardiovascular: regular rate and rhythm   Respiratory:  no respiratory distress, normal breath sounds   Abdomen: soft, non-tender; bowel sounds normal; no masses,  no organomegaly     Assessment:   Pregnancy: [id number] Patient Active Problem List   Diagnosis Date Noted   Supervision of other normal pregnancy, antepartum 09/04/2023   Hemoglobin S trait (HCC) 12/05/2016     Plan:  1. Supervision of other normal pregnancy, antepartum (Primary) New OB lab today - CBC/D/Plt+RPR+Rh+ABO+RubIgG... - HgB A1c - AFP, Serum, Open Spina Bifida - PANORAMA PRENATAL  TEST - Culture, OB Urine - [facility] SMA - Cervicovaginal ancillary only( [place]) - Prenatal Vit-Fe Fumarate-FA (MULTIVITAMIN-PRENATAL) 27-0.8 MG TABS tablet; Take 1 tablet by mouth daily at 12 noon.  Dispense: 30 tablet; Refill: 3 - Flu vaccine trivalent PF, 6mos and older(Flulaval,Afluria,Fluarix,Fluzone)  2. Hemoglobin S trait (HCC)   3. Nausea and vomiting of pregnancy, antepartum Rx for Phenergan - promethazine (PHENERGAN) 25 MG tablet; Take 0.5-1 tablets (12.5-25 mg total) by mouth every 6 (six) hours as needed for nausea or vomiting.  Dispense: 30 tablet; Refill: 1  4. Ptyalism Rx for Robinul - glycopyrrolate (ROBINUL-FORTE) 2 MG tablet; Take 1 tablet (2 mg total) by mouth 3 (three) times daily.  Dispense: 90 tablet; Refill: 3  5. [redacted] weeks gestation of pregnancy     Initial  labs drawn. Continue prenatal vitamins. Genetic Screening discussed, NIPS: ordered. Ultrasound discussed; fetal anatomic survey: ordered. Problem list reviewed and updat Routine obstetric precautions reviewed. Return in 4 weeks (on 10/10/2023).

## 2023-09-14 LAB — AFP, SERUM, OPEN SPINA BIFIDA
AFP MoM: 0.97
AFP Value: 25.8 ng/mL
Gest. Age on Collection Date: 15.2 wk
Maternal Age At EDD: 33.1 a
OSBR Risk 1 IN: 10000
Test Results:: NEGATIVE
Weight: 211 [lb_av]

## 2023-09-14 LAB — CBC/D/PLT+RPR+RH+ABO+RUBIGG...
Antibody Screen: NEGATIVE
Basophils Absolute: 0 10*3/uL (ref 0.0–0.2)
Basos: 0 %
EOS (ABSOLUTE): 0.1 10*3/uL (ref 0.0–0.4)
Eos: 1 %
HCV Ab: NONREACTIVE
HIV Screen 4th Generation wRfx: NONREACTIVE
Hematocrit: 36.4 % (ref 34.0–46.6)
Hemoglobin: 11.7 g/dL (ref 11.1–15.9)
Hepatitis B Surface Ag: NEGATIVE
Immature Grans (Abs): 0 10*3/uL (ref 0.0–0.1)
Immature Granulocytes: 0 %
Lymphocytes Absolute: 1.4 10*3/uL (ref 0.7–3.1)
Lymphs: 30 %
MCH: 26.1 pg — ABNORMAL LOW (ref 26.6–33.0)
MCHC: 32.1 g/dL (ref 31.5–35.7)
MCV: 81 fL (ref 79–97)
Monocytes Absolute: 0.3 10*3/uL (ref 0.1–0.9)
Monocytes: 6 %
Neutrophils Absolute: 3 10*3/uL (ref 1.4–7.0)
Neutrophils: 63 %
Platelets: 246 10*3/uL (ref 150–450)
RBC: 4.48 x10E6/uL (ref 3.77–5.28)
RDW: 13.9 % (ref 11.7–15.4)
RPR Ser Ql: NONREACTIVE
Rh Factor: POSITIVE
Rubella Antibodies, IGG: 12.4 {index} (ref >0.99)
WBC: 4.8 10*3/uL (ref 3.4–10.8)

## 2023-09-14 LAB — HCV INTERPRETATION

## 2023-09-14 LAB — HEMOGLOBIN A1C
Est. average glucose Bld gHb Est-mCnc: 108 mg/dL
Hgb A1c MFr Bld: 5.4 % (ref 4.8–5.6)

## 2023-09-14 LAB — URINE CULTURE, OB REFLEX

## 2023-09-14 LAB — CULTURE, OB URINE

## 2023-09-16 LAB — CERVICOVAGINAL ANCILLARY ONLY
Chlamydia: NEGATIVE
Comment: NEGATIVE
Comment: NORMAL
Neisseria Gonorrhea: NEGATIVE

## 2023-09-21 ENCOUNTER — Encounter: Payer: Self-pay | Admitting: Family Medicine

## 2023-09-21 LAB — PANORAMA PRENATAL TEST FULL PANEL:PANORAMA TEST PLUS 5 ADDITIONAL MICRODELETIONS: FETAL FRACTION: 6.5

## 2023-09-22 ENCOUNTER — Encounter: Payer: Self-pay | Admitting: *Deleted

## 2023-09-23 ENCOUNTER — Encounter: Payer: Self-pay | Admitting: *Deleted

## 2023-09-23 DIAGNOSIS — Z3480 Encounter for supervision of other normal pregnancy, unspecified trimester: Secondary | ICD-10-CM

## 2023-09-23 DIAGNOSIS — D573 Sickle-cell trait: Secondary | ICD-10-CM

## 2023-09-27 LAB — HORIZON SMA
REPORT SUMMARY: NEGATIVE
SPINAL MUSCULAR ATROPHY: NEGATIVE

## 2023-10-01 DIAGNOSIS — O99210 Obesity complicating pregnancy, unspecified trimester: Secondary | ICD-10-CM | POA: Insufficient documentation

## 2023-10-08 ENCOUNTER — Ambulatory Visit: Payer: Medicaid Other | Attending: Family Medicine

## 2023-10-08 ENCOUNTER — Other Ambulatory Visit: Payer: Self-pay

## 2023-10-08 ENCOUNTER — Ambulatory Visit: Payer: Self-pay | Admitting: *Deleted

## 2023-10-08 ENCOUNTER — Ambulatory Visit: Payer: Medicaid Other

## 2023-10-08 ENCOUNTER — Ambulatory Visit: Payer: Self-pay

## 2023-10-08 ENCOUNTER — Ambulatory Visit: Payer: Medicaid Other | Admitting: Obstetrics and Gynecology

## 2023-10-08 ENCOUNTER — Other Ambulatory Visit: Payer: Self-pay | Admitting: *Deleted

## 2023-10-08 ENCOUNTER — Encounter: Payer: Self-pay | Admitting: *Deleted

## 2023-10-08 VITALS — BP 129/80 | HR 89 | Wt 210.0 lb

## 2023-10-08 VITALS — BP 110/66 | HR 91

## 2023-10-08 DIAGNOSIS — Z3482 Encounter for supervision of other normal pregnancy, second trimester: Secondary | ICD-10-CM

## 2023-10-08 DIAGNOSIS — Z3689 Encounter for other specified antenatal screening: Secondary | ICD-10-CM | POA: Diagnosis present

## 2023-10-08 DIAGNOSIS — Z363 Encounter for antenatal screening for malformations: Secondary | ICD-10-CM | POA: Diagnosis not present

## 2023-10-08 DIAGNOSIS — O99210 Obesity complicating pregnancy, unspecified trimester: Secondary | ICD-10-CM

## 2023-10-08 DIAGNOSIS — Z362 Encounter for other antenatal screening follow-up: Secondary | ICD-10-CM

## 2023-10-08 DIAGNOSIS — D573 Sickle-cell trait: Secondary | ICD-10-CM | POA: Diagnosis not present

## 2023-10-08 DIAGNOSIS — Z3480 Encounter for supervision of other normal pregnancy, unspecified trimester: Secondary | ICD-10-CM | POA: Insufficient documentation

## 2023-10-08 DIAGNOSIS — O99012 Anemia complicating pregnancy, second trimester: Secondary | ICD-10-CM | POA: Insufficient documentation

## 2023-10-08 DIAGNOSIS — Z3A19 19 weeks gestation of pregnancy: Secondary | ICD-10-CM

## 2023-10-08 DIAGNOSIS — O99212 Obesity complicating pregnancy, second trimester: Secondary | ICD-10-CM | POA: Diagnosis not present

## 2023-10-08 NOTE — Progress Notes (Signed)
   PRENATAL VISIT NOTE  Subjective:  Katie Mayer is a 33 y.o. [phone] at [redacted]w[redacted]d being seen today for ongoing prenatal care.  She is currently monitored for the following issues for this low-risk pregnancy and has Hemoglobin S trait (HCC); Supervision of other normal pregnancy, antepartum; and Obesity affecting pregnancy, antepartum on their problem list.  Patient reports no complaints.  Contractions: Not present. Vag. Bleeding: None.  Movement: Absent. Denies leaking of fluid.   The following portions of the patient's history were reviewed and updated as appropriate: allergies, current medications, past family history, past medical history, past social history, past surgical history and problem list.   Objective:   Vitals:   10/08/23 1107  BP: 129/80  Pulse: 89  Weight: 210 lb (95.3 kg)    Fetal Status: Fetal Heart Rate (bpm): 150   Movement: Absent     General:  Alert, oriented and cooperative. Patient is in no acute distress.  Skin: Skin is warm and dry. No rash noted.   Cardiovascular: Normal heart rate noted  Respiratory: Normal respiratory effort, no problems with respiration noted  Abdomen: Soft, gravid, appropriate for gestational age.  Pain/Pressure: Absent     Pelvic: Cervical exam deferred        Extremities: Normal range of motion.  Edema: None  Mental Status: Normal mood and affect. Normal behavior. Normal judgment and thought content.   Assessment and Plan:  Pregnancy: G4P3003 at [redacted]w[redacted]d 1. [redacted] weeks gestation of pregnancy (Primary) Incomplete anatomy u/s mfm but nothing concerning. F/u final read. Weight stable.   Preterm labor symptoms and general obstetric precautions including but not limited to vaginal bleeding, contractions, leaking of fluid and fetal movement were reviewed in detail with the patient. Please refer to After Visit Summary for other counseling recommendations.   Return in 5 weeks (on 11/13/2023) for low risk ob, md or app, in  person.  Future Appointments  Date Time Provider Department Center  11/13/2023  8:30 AM [facility] [employer] [facility]    [place], MD

## 2023-11-13 ENCOUNTER — Ambulatory Visit: Payer: Medicaid Other | Attending: Obstetrics and Gynecology

## 2023-11-13 ENCOUNTER — Ambulatory Visit: Payer: Medicaid Other | Admitting: Family Medicine

## 2023-11-13 ENCOUNTER — Other Ambulatory Visit: Payer: Self-pay

## 2023-11-13 VITALS — BP 103/69 | HR 84 | Wt 214.0 lb

## 2023-11-13 DIAGNOSIS — O99012 Anemia complicating pregnancy, second trimester: Secondary | ICD-10-CM | POA: Diagnosis not present

## 2023-11-13 DIAGNOSIS — E669 Obesity, unspecified: Secondary | ICD-10-CM

## 2023-11-13 DIAGNOSIS — Z3A24 24 weeks gestation of pregnancy: Secondary | ICD-10-CM | POA: Diagnosis not present

## 2023-11-13 DIAGNOSIS — D573 Sickle-cell trait: Secondary | ICD-10-CM | POA: Diagnosis not present

## 2023-11-13 DIAGNOSIS — O99212 Obesity complicating pregnancy, second trimester: Secondary | ICD-10-CM | POA: Diagnosis not present

## 2023-11-13 DIAGNOSIS — Z362 Encounter for other antenatal screening follow-up: Secondary | ICD-10-CM | POA: Diagnosis present

## 2023-11-13 DIAGNOSIS — O99210 Obesity complicating pregnancy, unspecified trimester: Secondary | ICD-10-CM

## 2023-11-13 DIAGNOSIS — Z3480 Encounter for supervision of other normal pregnancy, unspecified trimester: Secondary | ICD-10-CM

## 2023-11-13 NOTE — Progress Notes (Signed)
   PRENATAL VISIT NOTE  Subjective:  Katie Mayer is a 33 y.o. [phone] at [redacted]w[redacted]d being seen today for ongoing prenatal care.  She is currently monitored for the following issues for this low-risk pregnancy and has Hemoglobin S trait (HCC); Supervision of other normal pregnancy, antepartum; and Obesity affecting pregnancy, antepartum on their problem list.  Patient reports no complaints.  Contractions: Not present. Vag. Bleeding: None.  Movement: Present. Denies leaking of fluid.   The following portions of the patient's history were reviewed and updated as appropriate: allergies, current medications, past family history, past medical history, past social history, past surgical history and problem list.   Objective:   Vitals:   11/13/23 1048  BP: 103/69  Pulse: 84  Weight: 214 lb (97.1 kg)    Fetal Status: Fetal Heart Rate (bpm): 147   Movement: Present     General:  Alert, oriented and cooperative. Patient is in no acute distress.  Skin: Skin is warm and dry. No rash noted.   Cardiovascular: Normal heart rate noted  Respiratory: Normal respiratory effort, no problems with respiration noted  Abdomen: Soft, gravid, appropriate for gestational age.  Pain/Pressure: Absent     Pelvic: Cervical exam deferred        Extremities: Normal range of motion.  Edema: None  Mental Status: Normal mood and affect. Normal behavior. Normal judgment and thought content.   Assessment and Plan:  Pregnancy: G4P3003 at [redacted]w[redacted]d 1. [redacted] weeks gestation of pregnancy (Primary)   2. Supervision of other normal pregnancy, antepartum Continue routine prenatal care.  3. Obesity affecting pregnancy, antepartum, unspecified obesity type   General obstetric precautions including but not limited to vaginal bleeding, contractions, leaking of fluid and fetal movement were reviewed in detail with the patient. Please refer to After Visit Summary for other counseling recommendations.   Return in 4 weeks (on  12/11/2023) for 28 wk labs.  Future Appointments  Date Time Provider Department Center  12/11/2023  8:20 AM [facility]  12/11/2023  8:55 AM Adam Phenix, MD [facility]    Reva Bores, MD

## 2023-11-18 ENCOUNTER — Telehealth: Admitting: Physician Assistant

## 2023-11-18 DIAGNOSIS — H1010 Acute atopic conjunctivitis, unspecified eye: Secondary | ICD-10-CM | POA: Diagnosis not present

## 2023-11-18 MED ORDER — LORATADINE 10 MG PO TABS: 10.0000 mg | ORAL_TABLET | Freq: Every day | ORAL | 0 refills | Status: DC

## 2023-11-18 NOTE — Progress Notes (Signed)
 Virtual Visit Consent   Katie Mayer, you are scheduled for a virtual visit with a [facility] provider today. Just as with appointments in the office, your consent must be obtained to participate. Your consent will be active for this visit and any virtual visit you may have with one of our providers in the next 365 days. If you have a MyChart account, a copy of this consent can be sent to you electronically.  As this is a virtual visit, video technology does not allow for your provider to perform a traditional examination. This may limit your provider's ability to fully assess your condition. If your provider identifies any concerns that need to be evaluated in person or the need to arrange testing (such as labs, EKG, etc.), we will make arrangements to do so. Although advances in technology are sophisticated, we cannot ensure that it will always work on either your end or our end. If the connection with a video visit is poor, the visit may have to be switched to a telephone visit. With either a video or telephone visit, we are not always able to ensure that we have a secure connection.  By engaging in this virtual visit, you consent to the provision of healthcare and authorize for your insurance to be billed (if applicable) for the services provided during this visit. Depending on your insurance coverage, you may receive a charge related to this service.  I need to obtain your verbal consent now. Are you willing to proceed with your visit today? Katie Mayer has provided verbal consent on 11/18/2023 for a virtual visit (video or telephone). Piedad Climes, [place]  Date: 11/18/2023 3:16 PM   Virtual Visit via Video Note   I, Piedad Climes, connected with  Katie Mayer  ([id number], May 04, 1991) on 11/18/23 at  2:30 PM EDT by a video-enabled telemedicine application and verified that I am speaking with the correct person using two identifiers.  Location: Patient: Virtual  Visit Location Patient: Home Provider: Virtual Visit Location Provider: Home Office   I discussed the limitations of evaluation and management by telemedicine and the availability of in person appointments. The patient expressed understanding and agreed to proceed.    History of Present Illness: Katie Mayer is a 33 y.o. who identifies as a female who was assigned female at birth, and is being seen today for rhinorrhea, nasal congestion, watery eyes. Notes history of allergy symptoms with season change. Denies fever, chills, sinus pain. Is currently pregnant and unsure what is safe to take.    HPI: HPI  Problems:  Patient Active Problem List   Diagnosis Date Noted   Obesity affecting pregnancy, antepartum 10/01/2023   Supervision of other normal pregnancy, antepartum 09/04/2023   Hemoglobin S trait (HCC) 12/05/2016    Allergies: No Known Allergies Medications:  Current Outpatient Medications:    loratadine (CLARITIN) 10 MG tablet, Take 1 tablet (10 mg total) by mouth daily., Disp: 30 tablet, Rfl: 0   glycopyrrolate (ROBINUL-FORTE) 2 MG tablet, Take 1 tablet (2 mg total) by mouth 3 (three) times daily., Disp: 90 tablet, Rfl: 3   Prenatal Vit-Fe Fumarate-FA (MULTIVITAMIN-PRENATAL) 27-0.8 MG TABS tablet, Take 1 tablet by mouth daily at 12 noon., Disp: 30 tablet, Rfl: 3   promethazine (PHENERGAN) 25 MG tablet, Take 0.5-1 tablets (12.5-25 mg total) by mouth every 6 (six) hours as needed for nausea or vomiting., Disp: 30 tablet, Rfl: 1  Observations/Objective: Patient is well-developed, well-nourished in no acute distress.  Resting comfortably at home.  Head is normocephalic, atraumatic.  No labored breathing. Speech is clear and coherent with logical content.  Patient is alert and oriented at baseline.   Assessment and Plan: 1. Seasonal allergic conjunctivitis (Primary) - loratadine (CLARITIN) 10 MG tablet; Take 1 tablet (10 mg total) by mouth daily.  Dispense: 30 tablet;  Refill: 0  Supportive measures and OTC medications reviewed. Gave handout of common OTC medications safe in pregnancy. Claritin per orders. Follow-up with OB as scheduled.   Follow Up Instructions: I discussed the assessment and treatment plan with the patient. The patient was provided an opportunity to ask questions and all were answered. The patient agreed with the plan and demonstrated an understanding of the instructions.  A copy of instructions were sent to the patient via MyChart unless otherwise noted below.   The patient was advised to call back or seek an in-person evaluation if the symptoms worsen or if the condition fails to improve as anticipated.    Piedad Climes, PA-C

## 2023-11-18 NOTE — Patient Instructions (Signed)
 Katie Mayer, thank you for joining Piedad Climes, PA-C for today's virtual visit.  While this provider is not your primary care provider (PCP), if your PCP is located in our provider database this encounter information will be shared with them immediately following your visit.   A [facility] MyChart account gives you access to today's visit and all your visits, tests, and labs performed at [facility] " click here if you don't have a [facility] MyChart account or go to [email or web address]  Consent: (Patient) Katie Mayer provided verbal consent for this virtual visit at the beginning of the encounter.  Current Medications:  Current Outpatient Medications:    loratadine (CLARITIN) 10 MG tablet, Take 1 tablet (10 mg total) by mouth daily., Disp: 30 tablet, Rfl: 0   glycopyrrolate (ROBINUL-FORTE) 2 MG tablet, Take 1 tablet (2 mg total) by mouth 3 (three) times daily., Disp: 90 tablet, Rfl: 3   Prenatal Vit-Fe Fumarate-FA (MULTIVITAMIN-PRENATAL) 27-0.8 MG TABS tablet, Take 1 tablet by mouth daily at 12 noon., Disp: 30 tablet, Rfl: 3   promethazine (PHENERGAN) 25 MG tablet, Take 0.5-1 tablets (12.5-25 mg total) by mouth every 6 (six) hours as needed for nausea or vomiting., Disp: 30 tablet, Rfl: 1   Medications ordered in this encounter:  Meds ordered this encounter  Medications   loratadine (CLARITIN) 10 MG tablet    Sig: Take 1 tablet (10 mg total) by mouth daily.    Dispense:  30 tablet    Refill:  0    Supervising Provider:   Merrilee Jansky [[id number]]     *If you need refills on other medications prior to your next appointment, please contact your pharmacy*  Follow-Up: Call back or seek an in-person evaluation if the symptoms worsen or if the condition fails to improve as anticipated.  [facility] [phone]  Other Instructions Common Medications Safe in Pregnancy  Acne:      Constipation:  Benzoyl  Peroxide     Colace  Clindamycin      Dulcolax Suppository  Topica Erythromycin     Fibercon  Salicylic Acid      Metamucil         Miralax AVOID:        Senakot   Accutane    Cough:  Retin-A       Cough Drops  Tetracycline      Phenergan w/ Codeine if Rx  Minocycline      Robitussin (Plain & DM)  Antibiotics:     Crabs/Lice:  Ceclor       RID  Cephalosporins    AVOID:  E-Mycins      Kwell  Keflex  Macrobid/Macrodantin   Diarrhea:  Penicillin      Kao-Pectate  Zithromax      Imodium AD         PUSH FLUIDS AVOID:       Cipro     Fever:  Tetracycline      Tylenol (Regular or Extra  Minocycline       Strength)  Levaquin      Extra Strength-Do not          Exceed 8 tabs/24 hrs Caffeine:        200mg /day (equiv. To 1 cup of coffee or  approx. 3 12 oz sodas)         Gas: Cold/Hayfever:       Gas-X  Benadryl      Mylicon  Claritin  Phazyme  **Claritin-D        Chlor-Trimeton    Headaches:  Dimetapp      ASA-Free Excedrin  Drixoral-Non-Drowsy     Cold Compress  Mucinex (Guaifenasin)     Tylenol (Regular or Extra  Sudafed/Sudafed-12 Hour     Strength)  **Sudafed PE Pseudoephedrine   Tylenol Cold & Sinus     Vicks Vapor Rub  Zyrtec  **AVOID if Problems With Blood Pressure         Heartburn: Avoid lying down for at least 1 hour after meals  Aciphex      Maalox     Rash:  Milk of Magnesia     Benadryl    Mylanta       1% Hydrocortisone Cream  Pepcid  Pepcid Complete   Sleep Aids:  Prevacid      Ambien   Prilosec       Benadryl  Rolaids       Chamomile Tea  Tums (Limit 4/day)     Unisom         Tylenol PM         Warm milk-add vanilla or  Hemorrhoids:       Sugar for taste  Anusol/Anusol H.C.  (RX: Analapram 2.5%)  Sugar Substitutes:  Hydrocortisone OTC     Ok in moderation  Preparation H      Tucks        Vaseline lotion applied to tissue with wiping    Herpes:     Throat:  Acyclovir      Oragel  Famvir  Valtrex     Vaccines:         Flu  Shot Leg Cramps:       *Gardasil  Benadryl      Hepatitis A         Hepatitis B Nasal Spray:       Pneumovax  Saline Nasal Spray     Polio Booster         Tetanus Nausea:       Tuberculosis test or PPD  Vitamin B6 25 mg TID   AVOID:    Dramamine      *Gardasil  Emetrol       Live Poliovirus  Ginger Root 250 mg QID    MMR (measles, mumps &  High Complex Carbs @ Bedtime    rebella)  Sea Bands-Accupressure    Varicella (Chickenpox)  Unisom 1/2 tab TID     *No known complications           If received before Pain:         Known pregnancy;   Darvocet       Resume series after  Lortab        Delivery  Percocet    Yeast:   Tramadol      Femstat  Tylenol 3      Gyne-lotrimin  Ultram       Monistat  Vicodin           MISC:         All Sunscreens           Hair Coloring/highlights          Insect Repellant's          (Including DEET)         Mystic Tans    If you have been instructed to have an in-person evaluation today at a local Urgent Care facility, please use the link below.  It will take you to a list of all of our available [facility], including address, phone number and hours of operation. Please do not delay care.  [facility]  If you or a family member do not have a primary care provider, use the link below to schedule a visit and establish care. When you choose a [facility] primary care physician or advanced practice provider, you gain a long-term partner in health. Find a Primary Care Provider  Learn more about [facility]'s in-office and virtual care options: [facility] - Get Care Now

## 2023-11-24 ENCOUNTER — Encounter: Payer: Self-pay | Admitting: Family Medicine

## 2023-12-11 ENCOUNTER — Ambulatory Visit: Payer: Self-pay | Admitting: Obstetrics & Gynecology

## 2023-12-11 ENCOUNTER — Other Ambulatory Visit: Payer: Self-pay

## 2023-12-11 ENCOUNTER — Other Ambulatory Visit

## 2023-12-11 VITALS — BP 106/75 | HR 98 | Wt 216.6 lb

## 2023-12-11 DIAGNOSIS — Z3A28 28 weeks gestation of pregnancy: Secondary | ICD-10-CM

## 2023-12-11 DIAGNOSIS — Z3480 Encounter for supervision of other normal pregnancy, unspecified trimester: Secondary | ICD-10-CM

## 2023-12-11 DIAGNOSIS — O99213 Obesity complicating pregnancy, third trimester: Secondary | ICD-10-CM

## 2023-12-11 DIAGNOSIS — O99210 Obesity complicating pregnancy, unspecified trimester: Secondary | ICD-10-CM

## 2023-12-11 DIAGNOSIS — D573 Sickle-cell trait: Secondary | ICD-10-CM

## 2023-12-11 DIAGNOSIS — O35EXX0 Maternal care for other (suspected) fetal abnormality and damage, fetal genitourinary anomalies, not applicable or unspecified: Secondary | ICD-10-CM | POA: Insufficient documentation

## 2023-12-11 MED ORDER — FLUTICASONE PROPIONATE 50 MCG/ACT NA SUSP: 2.0000 | Freq: Every day | NASAL | 2 refills | Status: DC

## 2023-12-11 NOTE — Progress Notes (Signed)
   PRENATAL VISIT NOTE  Subjective:  Katie Mayer is a 33 y.o. [phone] at [redacted]w[redacted]d being seen today for ongoing prenatal care.  She is currently monitored for the following issues for this low-risk pregnancy and has Hemoglobin S trait (HCC); Supervision of other normal pregnancy, antepartum; and Obesity affecting pregnancy, antepartum on their problem list.  Patient reports no bleeding, no leaking, vomiting, cough, and chest pain. Contractions: Regular.  .  Movement: Present. Denies leaking of fluid.   The following portions of the patient's history were reviewed and updated as appropriate: allergies, current medications, past family history, past medical history, past social history, past surgical history and problem list.   Objective:   Vitals:   12/11/23 0852  BP: 106/75  Pulse: 98  Weight: 216 lb 9.6 oz (98.2 kg)    Fetal Status: Fetal Heart Rate (bpm): 146   Movement: Present     General:  Alert, oriented and cooperative. Patient is in no acute distress.  Skin: Skin is warm and dry. No rash noted.   Cardiovascular: Normal heart rate noted  Respiratory: Normal respiratory effort, no problems with respiration noted  Abdomen: Soft, gravid, appropriate for gestational age.  Pain/Pressure: Absent     Pelvic: No cervical exam was performed at this visit.        Extremities: Normal range of motion.  Edema: None  Mental Status: Normal mood and affect. Normal behavior. Normal judgment and thought content.   Assessment and Plan:  Pregnancy: G4P3003 at [redacted]w[redacted]d 1. Obesity affecting pregnancy, antepartum, unspecified obesity type (Primary) - Acknowledge and support patient commitment to regular cardio 6 days/week  2. Supervision of other normal pregnancy, antepartum - Continue routine prenatal care - Continue Zyrtec as started in ED for allergy relief; monitor for Sx improvement and consider follow-up if Sx persist or worsen - Start Fluticasone  (FLONASE ) 50 MCG/ACT nasal spray; Place  2 sprays into both nostrils daily.  Dispense: 9.9 mL; Refill: 2  3. Hemoglobin S trait (HCC)   Preterm labor symptoms and general obstetric precautions including but not limited to vaginal bleeding, contractions, leaking of fluid and fetal movement were reviewed in detail with the patient. Please refer to After Visit Summary for other counseling recommendations.   Return in about 4 weeks (around 01/08/2024).  No future appointments.  Kathryn Parish, Medical Student

## 2023-12-13 LAB — GLUCOSE TOLERANCE, 2 HOURS W/ 1HR
Glucose, 1 hour: 148 mg/dL (ref 70–179)
Glucose, 2 hour: 116 mg/dL (ref 70–152)
Glucose, Fasting: 70 mg/dL (ref 70–91)

## 2023-12-13 LAB — CBC
Hematocrit: 31.9 % — ABNORMAL LOW (ref 34.0–46.6)
Hemoglobin: 10.5 g/dL — ABNORMAL LOW (ref 11.1–15.9)
MCH: 26.5 pg — ABNORMAL LOW (ref 26.6–33.0)
MCHC: 32.9 g/dL (ref 31.5–35.7)
MCV: 81 fL (ref 79–97)
Platelets: 193 10*3/uL (ref 150–450)
RBC: 3.96 x10E6/uL (ref 3.77–5.28)
RDW: 13.3 % (ref 11.7–15.4)
WBC: 5 10*3/uL (ref 3.4–10.8)

## 2023-12-13 LAB — RPR: RPR Ser Ql: NONREACTIVE

## 2023-12-13 LAB — HIV ANTIBODY (ROUTINE TESTING W REFLEX): HIV Screen 4th Generation wRfx: NONREACTIVE

## 2023-12-15 ENCOUNTER — Encounter: Payer: Self-pay | Admitting: Family Medicine

## 2023-12-15 MED ORDER — FERROUS SULFATE 325 (65 FE) MG PO TBEC: 325.0000 mg | DELAYED_RELEASE_TABLET | ORAL | 3 refills | Status: DC

## 2024-01-08 ENCOUNTER — Ambulatory Visit: Admitting: Obstetrics & Gynecology

## 2024-01-08 ENCOUNTER — Other Ambulatory Visit: Payer: Self-pay

## 2024-01-08 VITALS — BP 93/60 | HR 86 | Wt 217.0 lb

## 2024-01-08 DIAGNOSIS — Z3480 Encounter for supervision of other normal pregnancy, unspecified trimester: Secondary | ICD-10-CM

## 2024-01-08 DIAGNOSIS — O99113 Other diseases of the blood and blood-forming organs and certain disorders involving the immune mechanism complicating pregnancy, third trimester: Secondary | ICD-10-CM

## 2024-01-08 DIAGNOSIS — O99213 Obesity complicating pregnancy, third trimester: Secondary | ICD-10-CM

## 2024-01-08 DIAGNOSIS — E669 Obesity, unspecified: Secondary | ICD-10-CM | POA: Diagnosis not present

## 2024-01-08 DIAGNOSIS — Z3A32 32 weeks gestation of pregnancy: Secondary | ICD-10-CM

## 2024-01-08 DIAGNOSIS — D573 Sickle-cell trait: Secondary | ICD-10-CM | POA: Diagnosis not present

## 2024-01-08 DIAGNOSIS — O99210 Obesity complicating pregnancy, unspecified trimester: Secondary | ICD-10-CM

## 2024-01-08 MED ORDER — PREDNISONE 10 MG PO TABS: 10.0000 mg | ORAL_TABLET | Freq: Every day | ORAL | 0 refills | Status: DC

## 2024-01-08 MED ORDER — GUAIFENESIN-DM 100-10 MG/5ML PO SYRP: 5.0000 mL | ORAL_SOLUTION | ORAL | 0 refills | Status: DC | PRN

## 2024-01-08 MED ORDER — CETIRIZINE HCL 10 MG PO TABS: 10.0000 mg | ORAL_TABLET | Freq: Every day | ORAL | 0 refills | Status: DC

## 2024-01-08 NOTE — Progress Notes (Signed)
   PRENATAL VISIT NOTE  Subjective:  Katie Mayer is a 33 y.o. G4P3003 at [redacted]w[redacted]d being seen today for ongoing prenatal care.  She is currently monitored for the following issues for this high-risk pregnancy and has Hemoglobin S trait (HCC); Supervision of other normal pregnancy, antepartum; and Obesity affecting pregnancy, antepartum on their problem list.  Patient reports cough and nasal drainage.  Contractions: Irritability. Vag. Bleeding: None.  Movement: Present. Denies leaking of fluid.   The following portions of the patient's history were reviewed and updated as appropriate: allergies, current medications, past family history, past medical history, past social history, past surgical history and problem list.   Objective:    Vitals:   01/08/24 0839  BP: 93/60  Pulse: 86  Weight: 217 lb (98.4 kg)    Fetal Status:  Fetal Heart Rate (bpm): 138   Movement: Present    General: Alert, oriented and cooperative. Patient is in no acute distress.  Skin: Skin is warm and dry. No rash noted.   Cardiovascular: Normal heart rate noted  Respiratory: Normal respiratory effort, no problems with respiration noted  Abdomen: Soft, gravid, appropriate for gestational age.  Pain/Pressure: Absent     Pelvic: Cervical exam deferred        Extremities: Normal range of motion.  Edema: None  Mental Status: Normal mood and affect. Normal behavior. Normal judgment and thought content.   Assessment and Plan:  Pregnancy: G4P3003 at [redacted]w[redacted]d 1. Supervision of other normal pregnancy, antepartum (Primary) Likely URI - guaiFENesin -dextromethorphan (ROBITUSSIN DM) 100-10 MG/5ML syrup; Take 5 mLs by mouth every 4 (four) hours as needed for cough.  Dispense: 118 mL; Refill: 0  2. Hemoglobin S trait (HCC)   3. Obesity affecting pregnancy, antepartum, unspecified obesity type Body mass index is 35.02 kg/m.   Preterm labor symptoms and general obstetric precautions including but not limited to vaginal  bleeding, contractions, leaking of fluid and fetal movement were reviewed in detail with the patient. Please refer to After Visit Summary for other counseling recommendations.   Return in about 2 weeks (around 01/22/2024).  Future Appointments  Date Time Provider Department Center  01/22/2024  2:35 PM Kiki Pelton, MD [facility]  02/05/2024  9:15 AM Cherlynn Cornfield, NP [address] [facility]  02/12/2024 10:55 AM Zelma Hidden, FNP [facility]  02/19/2024  9:35 AM Tari Fare, CNM [facility]  02/26/2024  9:35 AM Tari Fare, CNM [facility]    Onnie Bilis, MD

## 2024-01-08 NOTE — Progress Notes (Signed)
 LC to room per Navigator to assist with ordering pump. LC successfully help parent order pump.

## 2024-01-08 NOTE — Patient Instructions (Signed)

## 2024-01-22 ENCOUNTER — Encounter: Admitting: Obstetrics and Gynecology

## 2024-01-28 ENCOUNTER — Other Ambulatory Visit: Payer: Self-pay | Admitting: Family Medicine

## 2024-01-28 DIAGNOSIS — Z3480 Encounter for supervision of other normal pregnancy, unspecified trimester: Secondary | ICD-10-CM

## 2024-01-30 ENCOUNTER — Other Ambulatory Visit: Payer: Self-pay | Admitting: Obstetrics & Gynecology

## 2024-02-05 ENCOUNTER — Other Ambulatory Visit (HOSPITAL_COMMUNITY)
Admission: RE | Admit: 2024-02-05 | Discharge: 2024-02-05 | Disposition: A | Discharge status: Home / Self Care | Source: Ambulatory Visit | Attending: Nurse Practitioner | Admitting: Nurse Practitioner

## 2024-02-05 ENCOUNTER — Ambulatory Visit: Payer: Self-pay | Admitting: Nurse Practitioner

## 2024-02-05 VITALS — BP 102/71 | HR 106 | Wt 218.0 lb

## 2024-02-05 DIAGNOSIS — Z3483 Encounter for supervision of other normal pregnancy, third trimester: Secondary | ICD-10-CM | POA: Diagnosis not present

## 2024-02-05 DIAGNOSIS — E66811 Obesity, class 1: Secondary | ICD-10-CM

## 2024-02-05 DIAGNOSIS — Z3A36 36 weeks gestation of pregnancy: Secondary | ICD-10-CM

## 2024-02-05 DIAGNOSIS — Z3480 Encounter for supervision of other normal pregnancy, unspecified trimester: Secondary | ICD-10-CM

## 2024-02-05 NOTE — Progress Notes (Signed)
   PRENATAL VISIT NOTE  Subjective:  Katie Mayer is a 33 y.o. [phone] at [redacted]w[redacted]d being seen today for ongoing prenatal care.  She is currently monitored for the following issues for this low-risk pregnancy and has Hemoglobin S trait (HCC); Supervision of other normal pregnancy, antepartum; and Obesity affecting pregnancy, antepartum on their problem list.  Patient reports mild leg cramps and charlie horse's occasionally  present.  Contractions: Not present. Vag. Bleeding: None.  Movement: Present. Denies leaking of fluid.   The following portions of the patient's history were reviewed and updated as appropriate: allergies, current medications, past family history, past medical history, past social history, past surgical history and problem list.   Objective:   Vitals:   02/05/24 1038  BP: 102/71  Pulse: (!) 106  Weight: 218 lb (98.9 kg)    Fetal Status: Fetal Heart Rate (bpm): 148 Fundal Height: 37 cm Movement: Present     General:  Alert, oriented and cooperative. Patient is in no acute distress.  Skin: Skin is warm and dry. No rash noted.   Cardiovascular: Normal heart rate noted  Respiratory: Normal respiratory effort, no problems with respiration noted  Abdomen: Soft, gravid, appropriate for gestational age.  Pain/Pressure: Present     Pelvic: Cervical exam deferred        Extremities: Normal range of motion.  Edema: None, B/L lower extremities, negative Homans' sign, negative clonus, no edema, full ROM   Mental Status: Normal mood and affect. Normal behavior. Normal judgment and thought content.   Assessment and Plan:  Pregnancy: G4P3003 at [redacted]w[redacted]d 1. [redacted] weeks gestation of pregnancy (Primary) - FH appropriate - FHR appropriate  - GBS and CX obtained  - Leg cramps and mild charlie horse's discussed with patient ( Advised increase in magnesium  and potassium in  diet and supplements)  2. Supervision of other normal pregnancy, antepartum - Anatomy ultrasound reviewed  11/13/23 (EFW 60% with no abnormalities visualized)   Preterm labor symptoms and general obstetric precautions including but not limited to vaginal bleeding, contractions, leaking of fluid and fetal movement were reviewed in detail with the patient. Please refer to After Visit Summary for other counseling recommendations.   No follow-ups on file.  Future Appointments  Date Time Provider Department Center  02/12/2024 10:55 AM Zelma Hidden, FNP [facility]  02/19/2024  9:35 AM Tari Fare, CNM [facility]  02/26/2024  9:35 AM Tari Fare, CNM [facility]    Cherlynn Cornfield, NP

## 2024-02-06 LAB — GC/CHLAMYDIA PROBE AMP (~~LOC~~) NOT AT ARMC
Chlamydia: NEGATIVE
Comment: NEGATIVE
Comment: NORMAL
Neisseria Gonorrhea: NEGATIVE

## 2024-02-10 ENCOUNTER — Ambulatory Visit: Payer: Self-pay | Admitting: Nurse Practitioner

## 2024-02-10 DIAGNOSIS — B951 Streptococcus, group B, as the cause of diseases classified elsewhere: Secondary | ICD-10-CM | POA: Insufficient documentation

## 2024-02-10 LAB — CULTURE, BETA STREP (GROUP B ONLY): Strep Gp B Culture: POSITIVE — AB

## 2024-02-12 ENCOUNTER — Ambulatory Visit (INDEPENDENT_AMBULATORY_CARE_PROVIDER_SITE_OTHER): Payer: Self-pay | Admitting: Obstetrics and Gynecology

## 2024-02-12 ENCOUNTER — Other Ambulatory Visit: Payer: Self-pay

## 2024-02-12 VITALS — BP 111/77 | HR 95 | Wt 218.5 lb

## 2024-02-12 DIAGNOSIS — B951 Streptococcus, group B, as the cause of diseases classified elsewhere: Secondary | ICD-10-CM

## 2024-02-12 DIAGNOSIS — Z3A37 37 weeks gestation of pregnancy: Secondary | ICD-10-CM | POA: Diagnosis not present

## 2024-02-12 DIAGNOSIS — Z3480 Encounter for supervision of other normal pregnancy, unspecified trimester: Secondary | ICD-10-CM | POA: Diagnosis not present

## 2024-02-12 NOTE — Progress Notes (Signed)
   PRENATAL VISIT NOTE  Subjective:  Katie Mayer is a 33 y.o. [phone] at [redacted]w[redacted]d being seen today for ongoing prenatal care.  She is currently monitored for the following issues for this low-risk pregnancy and has Hemoglobin S trait (HCC); Supervision of other normal pregnancy, antepartum; Obesity affecting pregnancy, antepartum; and Group beta Strep positive on their problem list.  Patient reports no complaints.   . Vag. Bleeding: None.  Movement: Present. Denies leaking of fluid.   The following portions of the patient's history were reviewed and updated as appropriate: allergies, current medications, past family history, past medical history, past social history, past surgical history and problem list.   Objective:    Vitals:   02/12/24 1109  BP: 111/77  Pulse: 95  Weight: 218 lb 8 oz (99.1 kg)    Fetal Status:  Fetal Heart Rate (bpm): 147 Fundal Height: 37 cm Movement: Present Presentation: Vertex  General: Alert, oriented and cooperative. Patient is in no acute distress.  Skin: Skin is warm and dry. No rash noted.   Cardiovascular: Normal heart rate noted  Respiratory: Normal respiratory effort, no problems with respiration noted  Abdomen: Soft, gravid, appropriate for gestational age.  Pain/Pressure: Absent     Pelvic: Cervical exam deferred        Extremities: Normal range of motion.  Edema: None  Mental Status: Normal mood and affect. Normal behavior. Normal judgment and thought content.   Assessment and Plan:  Pregnancy: G4P3003 at [redacted]w[redacted]d 1. Supervision of other normal pregnancy, antepartum (Primary) BP and FHR normal Doing well, feeling regular movement    2. [redacted] weeks gestation of pregnancy Labor precautions discussed  Still considering bc, she liked her IUD but did not like the experience with removal when her strings were lost. Does not desire implant. Is considering Depo vs IUD vs pills  3. Group beta Strep positive Reviewed gbs pos and treatment in  labor  Term labor symptoms and general obstetric precautions including but not limited to vaginal bleeding, contractions, leaking of fluid and fetal movement were reviewed in detail with the patient. Please refer to After Visit Summary for other counseling recommendations.   Return in one week for LOB  Future Appointments  Date Time Provider Department Center  02/19/2024  9:35 AM Emilio Delilah CHRISTELLA EDDY [facility]  02/26/2024  9:35 AM Emilio Delilah CHRISTELLA, CNM [facility]    Nidia Daring, FNP

## 2024-02-19 ENCOUNTER — Encounter: Payer: Self-pay | Admitting: Family Medicine

## 2024-02-19 ENCOUNTER — Ambulatory Visit: Payer: Self-pay | Admitting: Family Medicine

## 2024-02-19 ENCOUNTER — Other Ambulatory Visit: Payer: Self-pay

## 2024-02-19 VITALS — BP 94/63 | HR 88 | Wt 220.0 lb

## 2024-02-19 DIAGNOSIS — Z3480 Encounter for supervision of other normal pregnancy, unspecified trimester: Secondary | ICD-10-CM

## 2024-02-19 DIAGNOSIS — Z3A38 38 weeks gestation of pregnancy: Secondary | ICD-10-CM

## 2024-02-19 DIAGNOSIS — B951 Streptococcus, group B, as the cause of diseases classified elsewhere: Secondary | ICD-10-CM

## 2024-02-19 DIAGNOSIS — O99820 Streptococcus B carrier state complicating pregnancy: Secondary | ICD-10-CM

## 2024-02-19 NOTE — Patient Instructions (Addendum)
 LOW HEMOGLOBIN: Low hemoglobin is very common during pregnancy and is usually because iron is low. I would recommend we try to improve you hemoglobin level. We know that improving your pre-delivery hemoglobin reduces your risk of a hemorrhage.   I would recommend you try a supplement called Blood Builder in addition to your prenatal vitamin. It is available via [employer] and also at stores like [employer]. It uses plant based iron and is less prone to causing stomach and bowel upset. Here is a screen shot of the supplement.   Alternatively, you can start a regular iron supplement in addition to your prenatal vitamin. Either ferrous sulfate , ferrous gluconate, or ferrous fumarate. The recommendation is 100 mg every other day. These can all be found over-the-counter.  For some people this can be constipating, so you can also add a daily stool softener (Miralax) if needed.  About 10% of people will have difficulty with iron and it will cause GI upset. To reduce the chance of GI upset, take the supplement with food. Taking it at night so you sleep through potential side effects can also be helpful. If that does not help, taking a liquid formulation with food can help cause less GI symptoms.   Reasons to return to [facility] at [facility]: More than 36 weeks: You begin to have strong, frequent contractions 5 minutes apart or less, each last 1 minute, these have been going on for 1-2 hours, and you cannot walk or talk during them. Your water breaks.  Sometimes it is a big gush of fluid. However, many times it may it may be much more subtle. You should go to the hospital if you have a constant leakage of fluid from your vagina, enough to soak a pad when you are walking around.  You have vaginal bleeding.  It is normal to have a small amount of spotting if your cervix was checked. If you have bleeding requiring the use of a pad, go to the hospital. You don't feel your baby moving like  normal.  If you think that you baby's movement is decreased, eat a snack and rest on your left side in a quiet room for one hour. If you have not felt the baby move more than 6 times in an hour GO TO THE HOSPITAL.

## 2024-02-19 NOTE — Progress Notes (Signed)
   PRENATAL VISIT NOTE  Subjective:  Katie Mayer is a 33 y.o. G4P3003 at [redacted]w[redacted]d being seen today for ongoing prenatal care.  She is currently monitored for the following issues for this low-risk pregnancy and has Hemoglobin S trait (HCC); Supervision of other normal pregnancy, antepartum; Obesity affecting pregnancy, antepartum; and Group beta Strep positive on their problem list.  Patient reports no complaints.  Contractions: Irritability. Vag. Bleeding: None.  Movement: Present. Denies leaking of fluid.   The following portions of the patient's history were reviewed and updated as appropriate: allergies, current medications, past family history, past medical history, past social history, past surgical history and problem list.   Objective:    Vitals:   02/19/24 1029  BP: 94/63  Pulse: 88  Weight: 220 lb (99.8 kg)    Fetal Status:  Fetal Heart Rate (bpm): 138 Fundal Height: 38 cm Movement: Present    General: Alert, oriented and cooperative. Patient is in no acute distress.  Skin: Skin is warm and dry. No rash noted.   Cardiovascular: Normal heart rate noted  Respiratory: Normal respiratory effort, no problems with respiration noted  Abdomen: Soft, gravid, appropriate for gestational age.  Pain/Pressure: Present (lower back per pt)     Pelvic: Cervical exam deferred        Extremities: Normal range of motion.  Edema: None  Mental Status: Normal mood and affect. Normal behavior. Normal judgment and thought content.   Assessment and Plan:  Pregnancy: G4P3003 at [redacted]w[redacted]d 1. Supervision of other normal pregnancy, antepartum (Primary) Prenatal course reviewed BP, HR, FHR within normal limits Feeling regular FM  Discussed contraception again, leaning toward IUD but is concerned about abnormal bleeding that she had with Liletta  last time. Discussed that another IUD could be a good option, provided information in visit and AVS about copper IUD and other hormonal IUDs.  2. Group  beta Strep positive Treat in delivery  3. [redacted] weeks gestation of pregnancy Fundal height appropriate for gestational age.   Term labor symptoms and general obstetric precautions including but not limited to vaginal bleeding, contractions, leaking of fluid and fetal movement were reviewed in detail with the patient. Please refer to After Visit Summary for other counseling recommendations.   Return in about 1 week (around 02/26/2024) for LOB.  Future Appointments  Date Time Provider Department Center  02/26/2024  9:35 AM Emilio Delilah HERO, CNM [facility]    Joesph DELENA Sear, [place]

## 2024-02-26 ENCOUNTER — Telehealth: Admitting: Physician Assistant

## 2024-02-26 ENCOUNTER — Encounter: Payer: Self-pay | Admitting: Certified Nurse Midwife

## 2024-02-26 DIAGNOSIS — N649 Disorder of breast, unspecified: Secondary | ICD-10-CM

## 2024-02-26 DIAGNOSIS — Z3493 Encounter for supervision of normal pregnancy, unspecified, third trimester: Secondary | ICD-10-CM

## 2024-02-26 DIAGNOSIS — O99013 Anemia complicating pregnancy, third trimester: Secondary | ICD-10-CM

## 2024-02-26 DIAGNOSIS — Z3A39 39 weeks gestation of pregnancy: Secondary | ICD-10-CM

## 2024-02-26 DIAGNOSIS — Z3009 Encounter for other general counseling and advice on contraception: Secondary | ICD-10-CM

## 2024-02-26 DIAGNOSIS — L02419 Cutaneous abscess of limb, unspecified: Secondary | ICD-10-CM | POA: Diagnosis not present

## 2024-02-26 MED ORDER — CEPHALEXIN 500 MG PO CAPS: 500.0000 mg | ORAL_CAPSULE | Freq: Three times a day (TID) | ORAL | 0 refills | Status: AC

## 2024-02-26 NOTE — Patient Instructions (Signed)
  Katie Mayer, thank you for joining Katie Velma Lunger, PA-C for today's virtual visit.  While this provider is not your primary care provider (PCP), if your PCP is located in our provider database this encounter information will be shared with them immediately following your visit.   A [facility] MyChart account gives you access to today's visit and all your visits, tests, and labs performed at [facility]  click here if you don't have a [facility] MyChart account or go to [email or web address]  Consent: (Patient) Katie Mayer provided verbal consent for this virtual visit at the beginning of the encounter.  Current Medications:  Current Outpatient Medications:    cephALEXin  (KEFLEX ) 500 MG capsule, Take 1 capsule (500 mg total) by mouth 3 (three) times daily for 7 days., Disp: 21 capsule, Rfl: 0   ferrous sulfate  325 (65 FE) MG EC tablet, Take 1 tablet (325 mg total) by mouth every other day., Disp: 45 tablet, Rfl: 3   Prenatal Vit-Fe Fumarate-FA (M-NATAL PLUS) 27-1 MG TABS, TAKE 1 TABLET BY MOUTH DAILY AT 12 NOON., Disp: 90 tablet, Rfl: 2   Medications ordered in this encounter:  Meds ordered this encounter  Medications   cephALEXin  (KEFLEX ) 500 MG capsule    Sig: Take 1 capsule (500 mg total) by mouth 3 (three) times daily for 7 days.    Dispense:  21 capsule    Refill:  0    Supervising Provider:   BLAISE ALEENE Mayer [[id number]]     *If you need refills on other medications prior to your next appointment, please contact your pharmacy*  Follow-Up: Call back or seek an in-person evaluation if the symptoms worsen or if the condition fails to improve as anticipated.  [facility] [phone]  Other Instructions Please keep the skin clean and dry. Take the antibiotic as directed. If you note any non-resolving, new, or worsening symptoms despite treatment, please seek an in-person evaluation ASAP.   For the area on your breast, I  want you to follow-up with your OBGYN or PCP ASAP to have properly evaluated.     If you have been instructed to have an in-person evaluation today at a local Urgent Care facility, please use the link below. It will take you to a list of all of our available [facility], including address, phone number and hours of operation. Please do not delay care.  [facility]  If you or a family member do not have a primary care provider, use the link below to schedule a visit and establish care. When you choose a [facility] primary care physician or advanced practice provider, you gain a long-term partner in health. Find a Primary Care Provider  Learn more about [facility]'s in-office and virtual care options: [facility] - Get Care Now

## 2024-02-26 NOTE — Progress Notes (Signed)
 Virtual Visit Consent   [name], you are scheduled for a virtual visit with a  provider today. Just as with appointments in the office, your consent must be obtained to participate. Your consent will be active for this visit and any virtual visit you may have with one of our providers in the next 365 days. If you have a MyChart account, a copy of this consent can be sent to you electronically.  As this is a virtual visit, video technology does not allow for your provider to perform a traditional examination. This may limit your provider's ability to fully assess your condition. If your provider identifies any concerns that need to be evaluated in person or the need to arrange testing (such as labs, EKG, etc.), we will make arrangements to do so. Although advances in technology are sophisticated, we cannot ensure that it will always work on either your end or our end. If the connection with a video visit is poor, the visit may have to be switched to a telephone visit. With either a video or telephone visit, we are not always able to ensure that we have a secure connection.  By engaging in this virtual visit, you consent to the provision of healthcare and authorize for your insurance to be billed (if applicable) for the services provided during this visit. Depending on your insurance coverage, you may receive a charge related to this service.  I need to obtain your verbal consent now. Are you willing to proceed with your visit today? Katie Mayer has provided verbal consent on 02/26/2024 for a virtual visit (video or telephone). Katie Mayer, [place]  Date: 02/26/2024 5:22 PM   Virtual Visit via Video Note   I, Katie Mayer, connected with  Katie Mayer  ([id number], 1991/02/28) on 02/26/24 at  5:00 PM EDT by a video-enabled telemedicine application and verified that I am speaking with the correct person using two identifiers.  Location: Patient:  Virtual Visit Location Patient: Home Provider: Virtual Visit Location Provider: Home Office   I discussed the limitations of evaluation and management by telemedicine and the availability of in person appointments. The patient expressed understanding and agreed to proceed.    History of Present Illness: Katie Mayer is a 33 y.o. who identifies as a female who was assigned female at birth, and is being seen today for multiple concerns.  Patient notes small bump in L armpit, first noted 2 weeks ago that was mildly tender and swollen. Did topical antibiotic with initial improvement but now worsening again. Denies fever, chills. Notes moderate pain and warmth to the area.  Patient also noting a dark brown lesion on top of her R breast that started as a tiny dot and has progressively gotten larger since first noted a few days ago. Is not painful and does not itch. Notes the area is attached just at the base with the distal portion of lesion movable when touching with her finger.    HPI: HPI  Problems:  Patient Active Problem List   Diagnosis Date Noted   Group beta Strep positive 02/10/2024   Obesity affecting pregnancy, antepartum 10/01/2023   Supervision of other normal pregnancy, antepartum 09/04/2023   Hemoglobin S trait (HCC) 12/05/2016    Allergies: No Known Allergies Medications:  Current Outpatient Medications:    cephALEXin  (KEFLEX ) 500 MG capsule, Take 1 capsule (500 mg total) by mouth 3 (three) times daily for 7 days., Disp: 21 capsule, Rfl: 0  ferrous sulfate  325 (65 FE) MG EC tablet, Take 1 tablet (325 mg total) by mouth every other day., Disp: 45 tablet, Rfl: 3   Prenatal Vit-Fe Fumarate-FA (M-NATAL PLUS) 27-1 MG TABS, TAKE 1 TABLET BY MOUTH DAILY AT 12 NOON., Disp: 90 tablet, Rfl: 2  Observations/Objective: Patient is well-developed, well-nourished in no acute distress.  Resting comfortably  at home.  Head is normocephalic, atraumatic.  No labored breathing.   Speech is clear and coherent with logical content.  Patient is alert and oriented at baseline.  Small 1 cm brownish lesion noted on top of R breast. Very hard to assess via video due to poor video quality. Question if possibly an attached tick that is now swollen.   Assessment and Plan: 1. Axillary abscess (Primary) - cephALEXin  (KEFLEX ) 500 MG capsule; Take 1 capsule (500 mg total) by mouth 3 (three) times daily for 7 days.  Dispense: 21 capsule; Refill: 0  Supportive measures reviewed. She is [redacted] weeks pregnant. Will Rx Keflex  since safe for pregnancy. She is to take TID for 7 days. In person follow-up precautions reviewed with patient.   2. Lesion of breast  Hard to properly evaluate. Question possible attached and engorged tick versus wart/angioma, etc. Again very hard to discern giving quality of her video. She is to follow-up in person ASAP with PCP or GYN to evaluate this.   Follow Up Instructions: I discussed the assessment and treatment plan with the patient. The patient was provided an opportunity to ask questions and all were answered. The patient agreed with the plan and demonstrated an understanding of the instructions.  A copy of instructions were sent to the patient via MyChart unless otherwise noted below.   The patient was advised to call back or seek an in-person evaluation if the symptoms worsen or if the condition fails to improve as anticipated.    Katie Velma Lunger, PA-C

## 2024-03-04 ENCOUNTER — Ambulatory Visit: Admitting: Student

## 2024-03-04 ENCOUNTER — Ambulatory Visit

## 2024-03-04 ENCOUNTER — Other Ambulatory Visit: Payer: Self-pay

## 2024-03-04 ENCOUNTER — Encounter: Payer: Self-pay | Admitting: Student

## 2024-03-04 VITALS — BP 101/68 | HR 90 | Wt 219.0 lb

## 2024-03-04 DIAGNOSIS — O99820 Streptococcus B carrier state complicating pregnancy: Secondary | ICD-10-CM | POA: Diagnosis not present

## 2024-03-04 DIAGNOSIS — O26893 Other specified pregnancy related conditions, third trimester: Secondary | ICD-10-CM

## 2024-03-04 DIAGNOSIS — Z3480 Encounter for supervision of other normal pregnancy, unspecified trimester: Secondary | ICD-10-CM

## 2024-03-04 DIAGNOSIS — B951 Streptococcus, group B, as the cause of diseases classified elsewhere: Secondary | ICD-10-CM

## 2024-03-04 DIAGNOSIS — L918 Other hypertrophic disorders of the skin: Secondary | ICD-10-CM | POA: Diagnosis not present

## 2024-03-04 DIAGNOSIS — Z3A40 40 weeks gestation of pregnancy: Secondary | ICD-10-CM

## 2024-03-04 DIAGNOSIS — O480 Post-term pregnancy: Secondary | ICD-10-CM | POA: Diagnosis not present

## 2024-03-04 DIAGNOSIS — O99713 Diseases of the skin and subcutaneous tissue complicating pregnancy, third trimester: Secondary | ICD-10-CM

## 2024-03-04 DIAGNOSIS — R2233 Localized swelling, mass and lump, upper limb, bilateral: Secondary | ICD-10-CM

## 2024-03-04 LAB — FETAL NONSTRESS TEST

## 2024-03-04 NOTE — Progress Notes (Signed)
   PRENATAL VISIT NOTE  Subjective:  Katie Mayer is a 33 y.o. [phone] at [redacted]w[redacted]d being seen today for ongoing prenatal care.  She is currently monitored for the following issues for this low-risk pregnancy and has Hemoglobin S trait (HCC); Supervision of other normal pregnancy, antepartum; Obesity affecting pregnancy, antepartum; and Group beta Strep positive on their problem list.  Patient reports continued painful lumps under armpits. Patient reports having painful left breast lump that started 1 week ago. Patient had virtual visit and was started on Keflex . Patient has completed antibiotics and has not noticed improvement. Patient reports that at the onset of antibiotics a new lump presented under right arm. Patient has not started any additional therapy besides antibiotics. Patient also reports concern for skin changes and dark spots on chest and face (onset prior to pregnancy).    Contractions: Not present. Vag. Bleeding: None.  Movement: Present. Denies leaking of fluid.   The following portions of the patient's history were reviewed and updated as appropriate: allergies, current medications, past family history, past medical history, past social history, past surgical history and problem list.   Objective:    Vitals:   03/04/24 0828  BP: 101/68  Pulse: 90  Weight: 219 lb (99.3 kg)    Fetal Status:  Fetal Heart Rate (bpm): 135   Movement: Present    General: Alert, oriented and cooperative. Patient is in no acute distress.  Skin: Skin is warm and dry. No rash noted. + 2.10mm raised skin tag on right-upper-outer quadrant of right breast (11o'clock), bilateral axillary lumps, size indeterminate, localized pain,  (no redness, pitting edema, change in skin texture or integrity), Dark pinpoint dots generalized on face  Cardiovascular: Normal heart rate noted  Respiratory: Normal respiratory effort, no problems with respiration noted  Abdomen: Soft, gravid, appropriate for gestational  age.  Pain/Pressure: Absent     Pelvic: Cervical exam deferred        Extremities: Normal range of motion.  Edema: None  Mental Status: Normal mood and affect. Normal behavior. Normal judgment and thought content.   Assessment and Plan:  Pregnancy: G4P3003 at [redacted]w[redacted]d 1. Supervision of other normal pregnancy, antepartum (Primary) - Reports fetal movement - Contractions intermittent, not present today. Discussed signs of active labor  2. [redacted] weeks gestation of pregnancy - IOL discussed , request placed for 41 weeks- pending BPP results - Return for BPP later today  3. Group beta Strep positive - treatment in labor  4. Skin tag - Dermatology referral placed per patient preference - Discussed importance of Sunscreen  5. Mass of both axillae - Unlikely to be infectious due to bilateral presentation, but will continue to monitor closely - Discussed possibility for swelling/edema or breast milk congestion to be source of issue. Provided education on engorgement relief and lymphatic drainage - If symptoms do not improve, will need to consider breast ultrasound   Term labor symptoms and general obstetric precautions including but not limited to vaginal bleeding, contractions, leaking of fluid and fetal movement were reviewed in detail with the patient. Please refer to After Visit Summary for other counseling recommendations.   Return in about 7 weeks (around 04/22/2024) for PP.  Future Appointments  Date Time Provider Department Center  03/04/2024  2:15 PM [facility]  04/20/2024  4:15 PM Eveline Lynwood MATSU, MD [facility]    Nat Dauer, NP

## 2024-03-04 NOTE — Patient Instructions (Addendum)
  Management for painful lumps under armpits:   Tylenol  as needed, You can take 650mg  every 4-6 hours for pain relief Warm compress applied to area for 5-10 minutes can provide pain relief as well Gentle massage of the area under arms. Also, perform Lymphatic drainage as seen below After massaging, please apply cold or ice pack If symptoms do not improve within 3-4 days, we will plan for a breast ultrasound

## 2024-03-05 ENCOUNTER — Encounter: Payer: Self-pay | Admitting: Obstetrics & Gynecology

## 2024-03-05 ENCOUNTER — Other Ambulatory Visit: Payer: Self-pay

## 2024-03-05 ENCOUNTER — Inpatient Hospital Stay (HOSPITAL_COMMUNITY): Admitting: Anesthesiology

## 2024-03-05 ENCOUNTER — Inpatient Hospital Stay (HOSPITAL_COMMUNITY)
Admission: AD | Admit: 2024-03-05 | Discharge: 2024-03-07 | DRG: 807 | Disposition: A | Discharge status: Home / Self Care | Attending: Obstetrics & Gynecology | Admitting: Obstetrics & Gynecology

## 2024-03-05 ENCOUNTER — Encounter (HOSPITAL_COMMUNITY): Payer: Self-pay | Admitting: Obstetrics and Gynecology

## 2024-03-05 DIAGNOSIS — Z5941 Food insecurity: Secondary | ICD-10-CM

## 2024-03-05 DIAGNOSIS — O99824 Streptococcus B carrier state complicating childbirth: Secondary | ICD-10-CM | POA: Diagnosis present

## 2024-03-05 DIAGNOSIS — Z5982 Transportation insecurity: Secondary | ICD-10-CM

## 2024-03-05 DIAGNOSIS — O99820 Streptococcus B carrier state complicating pregnancy: Secondary | ICD-10-CM | POA: Diagnosis not present

## 2024-03-05 DIAGNOSIS — O99214 Obesity complicating childbirth: Secondary | ICD-10-CM | POA: Diagnosis present

## 2024-03-05 DIAGNOSIS — D573 Sickle-cell trait: Secondary | ICD-10-CM | POA: Diagnosis present

## 2024-03-05 DIAGNOSIS — O480 Post-term pregnancy: Secondary | ICD-10-CM | POA: Diagnosis not present

## 2024-03-05 DIAGNOSIS — O368130 Decreased fetal movements, third trimester, not applicable or unspecified: Principal | ICD-10-CM | POA: Diagnosis present

## 2024-03-05 DIAGNOSIS — B951 Streptococcus, group B, as the cause of diseases classified elsewhere: Secondary | ICD-10-CM | POA: Diagnosis present

## 2024-03-05 DIAGNOSIS — O368190 Decreased fetal movements, unspecified trimester, not applicable or unspecified: Principal | ICD-10-CM | POA: Diagnosis present

## 2024-03-05 DIAGNOSIS — Z8249 Family history of ischemic heart disease and other diseases of the circulatory system: Secondary | ICD-10-CM | POA: Diagnosis not present

## 2024-03-05 DIAGNOSIS — Z833 Family history of diabetes mellitus: Secondary | ICD-10-CM | POA: Diagnosis not present

## 2024-03-05 DIAGNOSIS — Z3A40 40 weeks gestation of pregnancy: Secondary | ICD-10-CM | POA: Diagnosis not present

## 2024-03-05 LAB — CBC
HCT: 35.3 % — ABNORMAL LOW (ref 36.0–46.0)
Hemoglobin: 11.7 g/dL — ABNORMAL LOW (ref 12.0–15.0)
MCH: 26.7 pg (ref 26.0–34.0)
MCHC: 33.1 g/dL (ref 30.0–36.0)
MCV: 80.6 fL (ref 80.0–100.0)
Platelets: 211 K/uL (ref 150–400)
RBC: 4.38 MIL/uL (ref 3.87–5.11)
RDW: 14.8 % (ref 11.5–15.5)
WBC: 4.9 K/uL (ref 4.0–10.5)
nRBC: 0 % (ref 0.0–0.2)

## 2024-03-05 LAB — TYPE AND SCREEN
ABO/RH(D): B POS
Antibody Screen: NEGATIVE

## 2024-03-05 MED ORDER — LIDOCAINE HCL (PF) 1 % IJ SOLN
INTRAMUSCULAR | Status: DC | PRN
Start: 2024-03-05 — End: 2024-03-06
  Administered 2024-03-05: 5 mL via EPIDURAL

## 2024-03-05 MED ORDER — EPHEDRINE 5 MG/ML INJ: 10.0000 mg | INTRAVENOUS | Status: DC | PRN

## 2024-03-05 MED ORDER — LACTATED RINGERS IV SOLN: INTRAVENOUS | Status: DC

## 2024-03-05 MED ORDER — OXYTOCIN BOLUS FROM INFUSION
333.0000 mL | Freq: Once | INTRAVENOUS | Status: AC
  Administered 2024-03-06: 333 mL via INTRAVENOUS

## 2024-03-05 MED ORDER — PENICILLIN G POT IN DEXTROSE 60000 UNIT/ML IV SOLN
3.0000 10*6.[IU] | INTRAVENOUS | Status: DC
  Administered 2024-03-05 (×2): 3 10*6.[IU] via INTRAVENOUS
  Filled 2024-03-05 (×2): qty 50

## 2024-03-05 MED ORDER — ACETAMINOPHEN 325 MG PO TABS
650.0000 mg | ORAL_TABLET | ORAL | Status: DC | PRN
  Administered 2024-03-05: 650 mg via ORAL
  Filled 2024-03-05: qty 2

## 2024-03-05 MED ORDER — FENTANYL-BUPIVACAINE-NACL 0.5-0.125-0.9 MG/250ML-% EP SOLN
12.0000 mL/h | EPIDURAL | Status: DC | PRN
  Administered 2024-03-05: 12 mL/h via EPIDURAL
  Filled 2024-03-05: qty 250

## 2024-03-05 MED ORDER — PHENYLEPHRINE 80 MCG/ML (10ML) SYRINGE FOR IV PUSH (FOR BLOOD PRESSURE SUPPORT): 80.0000 ug | PREFILLED_SYRINGE | INTRAVENOUS | Status: DC | PRN

## 2024-03-05 MED ORDER — OXYTOCIN-SODIUM CHLORIDE 30-0.9 UT/500ML-% IV SOLN
2.5000 [IU]/h | INTRAVENOUS | Status: DC
  Administered 2024-03-06: 2.5 [IU]/h via INTRAVENOUS
  Filled 2024-03-05: qty 500

## 2024-03-05 MED ORDER — LACTATED RINGERS IV BOLUS: 1000.0000 mL | Freq: Once | INTRAVENOUS | Status: DC

## 2024-03-05 MED ORDER — OXYCODONE-ACETAMINOPHEN 5-325 MG PO TABS: 2.0000 | ORAL_TABLET | ORAL | Status: DC | PRN

## 2024-03-05 MED ORDER — LACTATED RINGERS IV SOLN
500.0000 mL | Freq: Once | INTRAVENOUS | Status: AC
  Administered 2024-03-05: 500 mL via INTRAVENOUS

## 2024-03-05 MED ORDER — LIDOCAINE HCL (PF) 1 % IJ SOLN: 30.0000 mL | INTRAMUSCULAR | Status: DC | PRN

## 2024-03-05 MED ORDER — ONDANSETRON HCL 4 MG/2ML IJ SOLN
4.0000 mg | Freq: Four times a day (QID) | INTRAMUSCULAR | Status: DC | PRN
  Administered 2024-03-06: 4 mg via INTRAVENOUS
  Filled 2024-03-05: qty 2

## 2024-03-05 MED ORDER — SOD CITRATE-CITRIC ACID 500-334 MG/5ML PO SOLN: 30.0000 mL | ORAL | Status: DC | PRN

## 2024-03-05 MED ORDER — PHENYLEPHRINE 80 MCG/ML (10ML) SYRINGE FOR IV PUSH (FOR BLOOD PRESSURE SUPPORT)
80.0000 ug | PREFILLED_SYRINGE | INTRAVENOUS | Status: DC | PRN
  Filled 2024-03-05: qty 10

## 2024-03-05 MED ORDER — LEVONORGESTREL 20 MCG/DAY IU IUD: 1.0000 | INTRAUTERINE_SYSTEM | Freq: Once | INTRAUTERINE | Status: DC

## 2024-03-05 MED ORDER — TERBUTALINE SULFATE 1 MG/ML IJ SOLN: 0.2500 mg | Freq: Once | INTRAMUSCULAR | Status: DC | PRN

## 2024-03-05 MED ORDER — OXYTOCIN-SODIUM CHLORIDE 30-0.9 UT/500ML-% IV SOLN
1.0000 m[IU]/min | INTRAVENOUS | Status: DC
  Administered 2024-03-05: 2 m[IU]/min via INTRAVENOUS

## 2024-03-05 MED ORDER — DIPHENHYDRAMINE HCL 50 MG/ML IJ SOLN: 12.5000 mg | INTRAMUSCULAR | Status: DC | PRN

## 2024-03-05 MED ORDER — OXYCODONE-ACETAMINOPHEN 5-325 MG PO TABS: 1.0000 | ORAL_TABLET | ORAL | Status: DC | PRN

## 2024-03-05 MED ORDER — SODIUM CHLORIDE 0.9 % IV SOLN
5.0000 10*6.[IU] | Freq: Once | INTRAVENOUS | Status: AC
  Administered 2024-03-05: 5 10*6.[IU] via INTRAVENOUS
  Filled 2024-03-05: qty 5

## 2024-03-05 MED ORDER — LACTATED RINGERS IV SOLN: 500.0000 mL | INTRAVENOUS | Status: DC | PRN

## 2024-03-05 NOTE — MAU Provider Note (Signed)
 Chief Complaint:  Decreased Fetal Movement   HPI    Katie Mayer is a 33 y.o. G4P3003 at [redacted]w[redacted]d who presents to maternity admissions reporting decreased fetal movement for a couple of days.  She was seen in the office yesterday and had a biophysical performed with an NST.    Patient reports she was given fetal kick counts which she performed and did not feel any fetal movements within an hour this morning and therefore was told to come to the hospital.  Patient denies any vaginal bleeding leaking of fluid or contractions.   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 Due for removal 08/21/2027.      Leakage, amniotic fluid 08/20/2020   Normal labor 08/20/2020   Vaginal delivery 08/20/2020   OB History  Gravida Para Term Preterm AB Living  4 3 3   3   SAB IAB Ectopic Multiple Live Births     0 3    # Outcome Date GA Lbr Len/2nd Weight Sex Type Anes PTL Lv  4 Current           3 Term 08/20/20 [redacted]w[redacted]d 11:09 / 00:15 3075 g F Vag-Spont EPI  LIV  2 Term 05/16/17 [redacted]w[redacted]d 03:37 / 00:06 2795 g M Vag-Spont EPI  LIV  1 Term 01/20/16 [redacted]w[redacted]d / 00:49 3161 g M Vag-Spont EPI  LIV   Past Surgical History:  Procedure Laterality Date   WISDOM TOOTH EXTRACTION     Family History  Problem Relation Age of Onset   Hypertension Mother    Hypertension Father    Diabetes Paternal Grandfather    Social History   Tobacco Use   Smoking status: Never   Smokeless tobacco: Never  Vaping Use   Vaping status: Never Used  Substance Use Topics   Alcohol use: No   Drug use: No   No Known Allergies Medications Prior to Admission  Medication Sig Dispense Refill Last Dose/Taking   ferrous sulfate  325 (65 FE) MG EC tablet Take 1 tablet (325 mg total) by mouth every other day. 45 tablet 3  Past Month   Prenatal Vit-Fe Fumarate-FA (M-NATAL PLUS) 27-1 MG TABS TAKE 1 TABLET BY MOUTH DAILY AT 12 NOON. 90 tablet 2 Past Month    I have reviewed patient's Past Medical Hx, Surgical Hx, Family Hx, Social Hx, medications and allergies.   ROS  Pertinent items noted in HPI and remainder of comprehensive ROS otherwise negative.   PHYSICAL EXAM  Patient Vitals for the past 24 hrs:  BP Temp Temp src Pulse Resp SpO2 Height Weight  03/05/24 1214 127/77 97.9 F (36.6 C) Oral 99 16 100 % 5' 7 (1.702 m) 100.7 kg    Constitutional: Well-developed, well-nourished female in no acute distress.  Cardiovascular: normal rate & rhythm, warm and well-perfused Respiratory: normal effort, no problems with respiration noted GI: Abd soft, non-tender, gravid MS: Extremities nontender, no edema, normal ROM Neurologic: Alert and oriented x 4.  GU: no CVA tenderness Pelvic: Chaperoned by Lorice Montenegro , RN 3/70/-3 VTX  confirmed     Fetal Tracing: Cat 2  ( IVLR Bolus Started & positional change to left lateral side) Baseline: 145 Variability: moderate Accelerations: present Decelerations: 2 late decels with < 2 min variable noted  Toco: irregular 5-10 minutes  Labs: No results found for this or any previous visit (from the past 24 hours).  Imaging:  No results found.  MDM & MAU COURSE  MDM:  High-> Admission to LD  Decreased fetal movements @ [redacted]w[redacted]d  Plan for admission and IOL as DW Dr Erik [facility] Attending)   MAU Course: Orders Placed This Encounter  Procedures   CBC   RPR   Diet clear liquid Room service appropriate? Yes; Fluid consistency: Thin   Vitals signs per unit policy   Notify physician (specify)   Fetal monitoring per unit policy   Activity as tolerated   Cervical Exam   Measure blood pressure post delivery every 15 min x 1 hour then every 30 min x 1 hour   Fundal check post delivery every 15 min x 1 hour then every 30 min x 1 hour   Apply Labor & Delivery Care  Plan   If Rapid HIV test positive or known HIV positive: initiate AZT orders   May in and out cath x 2 for inability to void   Insert urethral catheter X 1 PRN If Coude Catheter is chosen, qualified resources by campus can be found in the clinical skills nursing procedure for Coude Catheter 1. If straight catheterized > 2 times or patient unable to void post epidural plac...   Refer to Sidebar Report Urinary (Foley) Catheter Indications   Refer to Sidebar Report Post Indwelling Urinary Catheter Removal and Intervention Guidelines   Discontinue foley prior to vaginal delivery   Initiate Oral Care Protocol   Initiate Carrier Fluid Protocol   SCDs   Full code   Type and screen [facility]   Insert and maintain IV Line   Admit to Inpatient (patient's expected length of stay will be greater than 2 midnights or inpatient only procedure)   Meds ordered this encounter  Medications   lactated ringers  infusion   oxytocin  (PITOCIN ) IV BOLUS FROM BAG   oxytocin  (PITOCIN ) IV infusion 30 units in NS 500 mL - Premix   lactated ringers  infusion 500-1,000 mL   acetaminophen  (TYLENOL ) tablet 650 mg   oxyCODONE -acetaminophen  (PERCOCET/ROXICET) 5-325 MG per tablet 1 tablet   oxyCODONE -acetaminophen  (PERCOCET/ROXICET) 5-325 MG per tablet 2 tablet   ondansetron  (ZOFRAN ) injection 4 mg   sodium citrate-citric acid  (ORACIT) solution 30 mL   lidocaine  (PF) (XYLOCAINE ) 1 % injection 30 mL   lactated ringers  bolus 1,000 mL       ASSESSMENT   1. Decreased fetal movement affecting management of pregnancy, antepartum, single or unspecified fetus   2. Post term pregnancy over 40 weeks     PLAN  Admit to LDR for IOL  Routine Admission orders GBS ppx  IV LR Bolus started in MAU DW Dr Erik [facility] Attending)  ------------------------------------------------ Olam Dalton, MSN, [facility], Center for [facility]

## 2024-03-05 NOTE — Anesthesia Procedure Notes (Signed)
 Epidural Patient location during procedure: OB Start time: 03/05/2024 10:23 PM End time: 03/05/2024 10:37 PM  Staffing Anesthesiologist: Jefm Garnette LABOR, MD Performed: anesthesiologist   Preanesthetic Checklist Completed: patient identified, IV checked, site marked, risks and benefits discussed, surgical consent, monitors and equipment checked, pre-op evaluation and timeout performed  Epidural Patient position: sitting Prep: DuraPrep and site prepped and draped Patient monitoring: continuous pulse ox and blood pressure Approach: midline Location: L3-L4 Injection technique: LOR air  Needle:  Needle type: Tuohy  Needle gauge: 17 G Needle length: 9 cm and 9 Needle insertion depth: 8 cm Catheter type: closed end flexible Catheter size: 19 Gauge Catheter at skin depth: 13 cm Test dose: negative  Assessment Events: blood not aspirated, no cerebrospinal fluid, injection not painful, no injection resistance, no paresthesia and negative IV test  Additional Notes Patient identified. Risks/Benefits/Options discussed with patient including but not limited to bleeding, infection, nerve damage, paralysis, failed block, incomplete pain control, headache, blood pressure changes, nausea, vomiting, reactions to medication both or allergic, itching and postpartum back pain. Confirmed with bedside nurse the patient's most recent platelet count. Confirmed with patient that they are not currently taking any anticoagulation, have any bleeding history or any family history of bleeding disorders. Patient expressed understanding and wished to proceed. All questions were answered. Sterile technique was used throughout the entire procedure. Please see nursing notes for vital signs. Test dose was given through epidural needle and negative prior to continuing to dose epidural or start infusion. Warning signs of high block given to the patient including shortness of breath, tingling/numbness in hands, complete  motor block, or any concerning symptoms with instructions to call for help. Patient was given instructions on fall risk and not to get out of bed. All questions and concerns addressed with instructions to call with any issues.  2 Attempt (S) . Patient tolerated procedure well.

## 2024-03-05 NOTE — H&P (Signed)
 OBSTETRIC ADMISSION HISTORY AND PHYSICAL  Katie Mayer is a 33 y.o. female [phone] with IUP at [redacted]w[redacted]d (dated by LMP, Estimated Date of Delivery: 03/03/24) presenting for decreased fetal movement.   She reports +FMs, No LOF, no VB, no blurry vision, headaches or peripheral edema, and RUQ pain.    She plans on breast feeding. She request IUD for birth control.  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 Due for removal 08/21/2027.      Leakage, amniotic fluid 08/20/2020   Normal labor 08/20/2020   Vaginal delivery 08/20/2020    Past Surgical History: Past Surgical History:  Procedure Laterality Date   WISDOM TOOTH EXTRACTION      Obstetrical History: OB History     Gravida  4   Para  3   Term  3   Preterm      AB      Living  3      SAB      IAB      Ectopic      Multiple  0   Live Births  3           Social History Social History   Socioeconomic History   Marital status: Married    Spouse name: Not on file   Number of children: Not on file   Years of education: Not on file   Highest education level: Not on file  Occupational History   Not on file  Tobacco Use   Smoking status: Never   Smokeless tobacco: Never  Vaping Use   Vaping status: Never Used  Substance and Sexual Activity   Alcohol use: No   Drug use: No   Sexual activity: Yes    Birth control/protection: None  Other Topics Concern   Not on file  Social History Narrative   Not on file   Social Drivers of Health   Financial Resource Strain: Not on file  Food Insecurity: Food Insecurity Present (03/05/2024)   Hunger Vital Sign    Worried About Running Out of Food  in the Last Year: Never true    Ran Out of Food in the Last Year: Sometimes true  Transportation Needs: Unmet Transportation Needs (03/05/2024)   PRAPARE - Administrator, Civil Service (Medical): Yes    Lack of Transportation (Non-Medical): Yes  Physical Activity: Not on file  Stress: Not on file  Social Connections: Not on file    Family History: Family History  Problem Relation Age of Onset   Hypertension Mother    Hypertension Father    Diabetes Paternal Grandfather     Allergies: No Known Allergies  Medications Prior to Admission  Medication Sig Dispense Refill Last Dose/Taking   ferrous sulfate  325 (65 FE) MG EC tablet Take 1 tablet (325 mg total) by mouth every other day. 45 tablet 3 Past Month   Prenatal Vit-Fe Fumarate-FA (M-NATAL PLUS) 27-1 MG TABS TAKE 1 TABLET BY MOUTH DAILY AT 12 NOON. 90 tablet 2 Past Month     Review of Systems  All systems reviewed and negative except as stated in HPI.  Blood pressure 117/77, pulse 86, temperature 97.7  F (36.5 C), temperature source Oral, resp. rate 18, height 5' 7 (1.702 m), weight 100.7 kg, last menstrual period 05/28/2023, SpO2 100%, unknown if currently breastfeeding. General appearance: alert and cooperative Lungs: breathing comfortably on room air Heart: regular rate Abdomen: soft, non-tender; gravid Extremities: no edema of bilateral lower extremities Presentation: cephalic Fetal monitoring: 135/mod/+a/-d Uterine activity: every 2-8 min Dilation: 3 Effacement (%): 70 Station: -2 Exam by:: Voncile, RN   Prenatal labs: ABO, Rh: --/--/B POS (07/18 1316) Antibody: NEG (07/18 1316) Rubella: 12.40 (01/24 1032) RPR: Non Reactive (04/24 0834)  HBsAg: Negative (01/24 1032)  HIV: Non Reactive (04/24 0834)  GBS: Positive/-- (06/20 0839)  2 hr Glucola wnl Genetic screening  low risk, female Anatomy US  wnl Last US : At [redacted]w[redacted]d - EFW 60 %tile  Prenatal Transfer Tool  Maternal Diabetes: No Genetic Screening:  Normal Maternal Ultrasounds/Referrals: Normal Fetal Ultrasounds or other Referrals:  None Maternal Substance Abuse:  No Significant Maternal Medications:  None Significant Maternal Lab Results:  Group B Strep positive Number of Prenatal Visits:greater than 3 verified prenatal visits Other Comments:  None  Results for orders placed or performed during the hospital encounter of 03/05/24 (from the past 24 hours)  Type and screen [facility]   Collection Time: 03/05/24  1:16 PM  Result Value Ref Range   ABO/RH(D) B POS    Antibody Screen NEG    Sample Expiration      03/08/2024,2359 Performed at [facility], [address], [place], [place] [address]   CBC   Collection Time: 03/05/24  1:30 PM  Result Value Ref Range   WBC 4.9 4.0 - 10.5 K/uL   RBC 4.38 3.87 - 5.11 MIL/uL   Hemoglobin 11.7 (L) 12.0 - 15.0 g/dL   HCT 64.6 (L) 63.9 - 53.9 %   MCV 80.6 80.0 - 100.0 fL   MCH 26.7 26.0 - 34.0 pg   MCHC 33.1 30.0 - 36.0 g/dL   RDW 85.1 88.4 - 84.4 %   Platelets 211 150 - 400 K/uL   nRBC 0.0 0.0 - 0.2 %    Patient Active Problem List   Diagnosis Date Noted   Decreased fetal movement 03/05/2024   Group beta Strep positive 02/10/2024   Obesity affecting pregnancy, antepartum 10/01/2023   Supervision of other normal pregnancy, antepartum 09/04/2023   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 G4P3003 at [redacted]w[redacted]d here for IOL due to DFM  #Labor: Discussed AROM and Pitocin  for augmentation of latent labor  would like to defer AROM for now -- typically progresses quickly after AROM and awaiting arrival of husband  will start Pitocin  2x2 #Pain: Epidural #FWB: Cat I #ID:  GBS pos > PCN #MOF: Breast #MOC: IUD #Circ:  Desires   Alain Sor, MD OB Fellow, Faculty Practice [facility], Center for [facility] 03/05/2024 6:41 PM

## 2024-03-05 NOTE — MAU Note (Signed)
.  Katie Mayer is a 33 y.o. at [redacted]w[redacted]d here in MAU reporting: decreased fetal movement since 0800 this morning. States she was seen in the office yesterday for DMF and a BPP was done and they noticed no breathing or movement and was told to monitor baby. Endorses fetal movement since coming to the room. Denies vaginal bleeding and LOF.    Onset of complaint: 03/04/24 Pain score: 0/10 Vitals:   03/05/24 1214  BP: 127/77  Pulse: 99  Resp: 16  Temp: 97.9 F (36.6 C)  SpO2: 100%     FHT: 140  Lab orders placed from triage: None

## 2024-03-05 NOTE — Plan of Care (Signed)
  Problem: Education: Goal: Knowledge of Childbirth will improve Outcome: Progressing Goal: Ability to make informed decisions regarding treatment and plan of care will improve Outcome: Progressing Goal: Ability to state and carry out methods to decrease the pain will improve Outcome: Progressing Goal: Individualized Educational Video(s) Outcome: Progressing   Problem: Coping: Goal: Ability to verbalize concerns and feelings about labor and delivery will improve Outcome: Progressing   Problem: Life Cycle: Goal: Ability to make normal progression through stages of labor will improve Outcome: Progressing Goal: Ability to effectively push during vaginal delivery will improve Outcome: Progressing   Problem: Role Relationship: Goal: Will demonstrate positive interactions with the child Outcome: Progressing   Problem: Safety: Goal: Risk of complications during labor and delivery will decrease Outcome: Progressing   Problem: Pain Management: Goal: Relief or control of pain from uterine contractions will improve Outcome: Progressing   Problem: Education: Goal: Knowledge of General Education information will improve Description: Including pain rating scale, medication(s)/side effects and non-pharmacologic comfort measures Outcome: Progressing   Problem: Health Behavior/Discharge Planning: Goal: Ability to manage health-related needs will improve Outcome: Progressing   Problem: Clinical Measurements: Goal: Ability to maintain clinical measurements within normal limits will improve Outcome: Progressing Goal: Will remain free from infection Outcome: Progressing Goal: Diagnostic test results will improve Outcome: Progressing Goal: Respiratory complications will improve Outcome: Progressing Goal: Cardiovascular complication will be avoided Outcome: Progressing   Problem: Activity: Goal: Risk for activity intolerance will decrease Outcome: Progressing   Problem:  Nutrition: Goal: Adequate nutrition will be maintained Outcome: Progressing   Problem: Coping: Goal: Level of anxiety will decrease Outcome: Progressing   Problem: Elimination: Goal: Will not experience complications related to bowel motility Outcome: Progressing Goal: Will not experience complications related to urinary retention Outcome: Progressing   Problem: Pain Managment: Goal: General experience of comfort will improve and/or be controlled Outcome: Progressing   Problem: Safety: Goal: Ability to remain free from injury will improve Outcome: Progressing   Problem: Skin Integrity: Goal: Risk for impaired skin integrity will decrease Outcome: Progressing

## 2024-03-05 NOTE — Anesthesia Preprocedure Evaluation (Signed)
 Anesthesia Evaluation  Patient identified by MRN, date of birth, ID band Patient awake    Reviewed: Allergy & Precautions, NPO status , Patient's Chart, lab work & pertinent test results  Airway Mallampati: II  TM Distance: >3 FB Neck ROM: Full    Dental no notable dental hx. (+) Teeth Intact, Dental Advisory Given   Pulmonary neg pulmonary ROS   Pulmonary exam normal breath sounds clear to auscultation       Cardiovascular Normal cardiovascular exam Rhythm:Regular Rate:Normal     Neuro/Psych negative neurological ROS     GI/Hepatic negative GI ROS, Neg liver ROS,,,  Endo/Other  negative endocrine ROS    Renal/GU negative Renal ROS     Musculoskeletal negative musculoskeletal ROS (+)    Abdominal   Peds  Hematology Lab Results      Component                Value               Date                      WBC                      4.9                 03/05/2024                HGB                      11.7 (L)            03/05/2024                HCT                      35.3 (L)            03/05/2024                MCV                      80.6                03/05/2024                PLT                      211                 03/05/2024              Anesthesia Other Findings NKDA  Reproductive/Obstetrics (+) Pregnancy                              Anesthesia Physical Anesthesia Plan  ASA: 2  Anesthesia Plan: Epidural   Post-op Pain Management:    Induction:   PONV Risk Score and Plan:   Airway Management Planned:   Additional Equipment:   Intra-op Plan:   Post-operative Plan:   Informed Consent: I have reviewed the patients History and Physical, chart, labs and discussed the procedure including the risks, benefits and alternatives for the proposed anesthesia with the patient or authorized representative who has indicated his/her understanding and acceptance.       Plan  Discussed with: CRNA and Surgeon  Anesthesia Plan Comments: (40.2  wk G4P3 for LEA)        Anesthesia Quick Evaluation

## 2024-03-05 NOTE — Progress Notes (Signed)
 Katie Mayer is a 33 y.o. [phone] at [redacted]w[redacted]d admitted for induction of labor due to decreased fetal movement but spontaneous   Subjective: Pt feeling intermittent contractions, husband in room for support.   Objective: BP 115/64   Pulse 79   Temp 98.2 F (36.8 C) (Oral)   Resp 17   Ht 5' 7 (1.702 m)   Wt 100.7 kg   LMP 05/28/2023   SpO2 100%   BMI 34.77 kg/m  No intake/output data recorded. No intake/output data recorded.  FHT:  FHR: 145 bpm, variability: moderate,  accelerations:  Present,  decelerations:  Absent UC:   irregular, every 2-6 minutes SVE:   Dilation: 5 Effacement (%): 50 Station: -2 Exam by:: Katie Mayer CNM Pt had SROM while CNM in room discussing AROM.  Fluid clear and moderate in amount.  Cervical exam after SROM.    Labs: Lab Results  Component Value Date   WBC 4.9 03/05/2024   HGB 11.7 (L) 03/05/2024   HCT 35.3 (L) 03/05/2024   MCV 80.6 03/05/2024   PLT 211 03/05/2024    Assessment / Plan: Initially admitted for IOL for DFM but pt with SROM and cervical change so spontaneously laboring  Labor: Progressing normally Preeclampsia:  n/a Fetal Wellbeing:  Category I Pain Control:  Labor support without medications I/D:  GBS pos, PCN x 4 + hours Anticipated MOD:  NSVD  Katie Boards, CNM 03/05/2024, 9:50 PM

## 2024-03-06 ENCOUNTER — Encounter (HOSPITAL_COMMUNITY): Payer: Self-pay | Admitting: Obstetrics and Gynecology

## 2024-03-06 DIAGNOSIS — O368130 Decreased fetal movements, third trimester, not applicable or unspecified: Secondary | ICD-10-CM

## 2024-03-06 DIAGNOSIS — O99820 Streptococcus B carrier state complicating pregnancy: Secondary | ICD-10-CM

## 2024-03-06 DIAGNOSIS — O480 Post-term pregnancy: Secondary | ICD-10-CM

## 2024-03-06 DIAGNOSIS — Z3A40 40 weeks gestation of pregnancy: Secondary | ICD-10-CM

## 2024-03-06 LAB — CBC
HCT: 36.9 % (ref 36.0–46.0)
Hemoglobin: 12.4 g/dL (ref 12.0–15.0)
MCH: 26.7 pg (ref 26.0–34.0)
MCHC: 33.6 g/dL (ref 30.0–36.0)
MCV: 79.5 fL — ABNORMAL LOW (ref 80.0–100.0)
Platelets: 183 K/uL (ref 150–400)
RBC: 4.64 MIL/uL (ref 3.87–5.11)
RDW: 14.7 % (ref 11.5–15.5)
WBC: 8.4 K/uL (ref 4.0–10.5)
nRBC: 0 % (ref 0.0–0.2)

## 2024-03-06 LAB — RPR: RPR Ser Ql: NONREACTIVE

## 2024-03-06 MED ORDER — BENZOCAINE-MENTHOL 20-0.5 % EX AERO: 1.0000 | INHALATION_SPRAY | CUTANEOUS | Status: DC | PRN

## 2024-03-06 MED ORDER — COCONUT OIL OIL
1.0000 | TOPICAL_OIL | Status: DC | PRN
  Administered 2024-03-07: 1 via TOPICAL

## 2024-03-06 MED ORDER — SIMETHICONE 80 MG PO CHEW: 80.0000 mg | CHEWABLE_TABLET | ORAL | Status: DC | PRN

## 2024-03-06 MED ORDER — IBUPROFEN 600 MG PO TABS
600.0000 mg | ORAL_TABLET | Freq: Four times a day (QID) | ORAL | Status: DC
  Administered 2024-03-06 – 2024-03-07 (×6): 600 mg via ORAL
  Filled 2024-03-06 (×6): qty 1

## 2024-03-06 MED ORDER — OXYCODONE HCL 5 MG PO TABS: 5.0000 mg | ORAL_TABLET | ORAL | Status: DC | PRN

## 2024-03-06 MED ORDER — OXYTOCIN-SODIUM CHLORIDE 30-0.9 UT/500ML-% IV SOLN: 1.0000 m[IU]/min | INTRAVENOUS | Status: DC

## 2024-03-06 MED ORDER — FERROUS SULFATE 325 (65 FE) MG PO TABS
325.0000 mg | ORAL_TABLET | ORAL | Status: DC
  Administered 2024-03-06: 325 mg via ORAL
  Filled 2024-03-06: qty 1

## 2024-03-06 MED ORDER — DIBUCAINE (PERIANAL) 1 % EX OINT: 1.0000 | TOPICAL_OINTMENT | CUTANEOUS | Status: DC | PRN

## 2024-03-06 MED ORDER — SODIUM CHLORIDE 0.9% FLUSH: 3.0000 mL | INTRAVENOUS | Status: DC | PRN

## 2024-03-06 MED ORDER — SENNOSIDES-DOCUSATE SODIUM 8.6-50 MG PO TABS
2.0000 | ORAL_TABLET | ORAL | Status: DC
  Administered 2024-03-06 – 2024-03-07 (×2): 2 via ORAL
  Filled 2024-03-06 (×2): qty 2

## 2024-03-06 MED ORDER — ONDANSETRON HCL 4 MG/2ML IJ SOLN: 4.0000 mg | INTRAMUSCULAR | Status: DC | PRN

## 2024-03-06 MED ORDER — MEASLES, MUMPS & RUBELLA VAC IJ SOLR: 0.5000 mL | Freq: Once | INTRAMUSCULAR | Status: DC

## 2024-03-06 MED ORDER — ZOLPIDEM TARTRATE 5 MG PO TABS: 5.0000 mg | ORAL_TABLET | Freq: Every evening | ORAL | Status: DC | PRN

## 2024-03-06 MED ORDER — DIPHENHYDRAMINE HCL 25 MG PO CAPS: 25.0000 mg | ORAL_CAPSULE | Freq: Four times a day (QID) | ORAL | Status: DC | PRN

## 2024-03-06 MED ORDER — ACETAMINOPHEN 325 MG PO TABS
650.0000 mg | ORAL_TABLET | ORAL | Status: DC | PRN
Start: 2024-03-06 — End: 2024-03-07

## 2024-03-06 MED ORDER — TETANUS-DIPHTH-ACELL PERTUSSIS 5-2.5-18.5 LF-MCG/0.5 IM SUSY
0.5000 mL | PREFILLED_SYRINGE | Freq: Once | INTRAMUSCULAR | Status: AC
  Administered 2024-03-07: 0.5 mL via INTRAMUSCULAR
  Filled 2024-03-06: qty 0.5

## 2024-03-06 MED ORDER — ONDANSETRON HCL 4 MG PO TABS: 4.0000 mg | ORAL_TABLET | ORAL | Status: DC | PRN

## 2024-03-06 MED ORDER — SODIUM CHLORIDE 0.9 % IV SOLN: 250.0000 mL | INTRAVENOUS | Status: DC | PRN

## 2024-03-06 MED ORDER — TERBUTALINE SULFATE 1 MG/ML IJ SOLN: 0.2500 mg | Freq: Once | INTRAMUSCULAR | Status: DC | PRN

## 2024-03-06 MED ORDER — PRENATAL MULTIVITAMIN CH
1.0000 | ORAL_TABLET | Freq: Every day | ORAL | Status: DC
  Administered 2024-03-06 – 2024-03-07 (×2): 1 via ORAL
  Filled 2024-03-06 (×2): qty 1

## 2024-03-06 MED ORDER — LACTATED RINGERS AMNIOINFUSION: INTRAVENOUS | Status: DC

## 2024-03-06 MED ORDER — SODIUM CHLORIDE 0.9% FLUSH: 3.0000 mL | Freq: Two times a day (BID) | INTRAVENOUS | Status: DC

## 2024-03-06 MED ORDER — WITCH HAZEL-GLYCERIN EX PADS
1.0000 | MEDICATED_PAD | CUTANEOUS | Status: DC | PRN
Start: 2024-03-06 — End: 2024-03-07

## 2024-03-06 NOTE — Discharge Summary (Shared)
 Postpartum Discharge Summary  Date of Service updated***     Patient Name: Katie Mayer DOB: [birth year] MRN: [id number]  Date of admission: 03/05/2024 Delivery date:03/06/2024 Delivering provider: MILLY PLANAS A Date of discharge: 03/06/2024  Admitting diagnosis: Decreased fetal movement [O36.8190] Intrauterine pregnancy: [redacted]w[redacted]d     Secondary diagnosis:  Principal Problem:   Decreased fetal movement Active Problems:   NSVD (normal spontaneous vaginal delivery)   Hemoglobin S trait (HCC)   Group beta Strep positive  Additional problems: ***    Discharge diagnosis: Term Pregnancy Delivered                                              Post partum procedures:{Postpartum procedures:23558} Augmentation: Pitocin  and Amnioinfusion for variable decels Complications: {OB Labor/Delivery Complications:20784}  Hospital course: Induction of Labor With Vaginal Delivery   33 y.o. yo [phone] at [redacted]w[redacted]d was admitted to the hospital 03/05/2024 for induction of labor.  Indication for induction: decreased fetal movements after term.  Patient had an labor course complicated by deep recurrent variable decels due to two tight nuchal and a body cord.  Membrane Rupture Time/Date: 9:46 PM,03/05/2024  Delivery Method:Vaginal, Spontaneous Operative Delivery:N/A Episiotomy: None Lacerations:  None Details of delivery can be found in separate delivery note.  Patient had a postpartum course complicated by***. Patient is discharged home 03/06/24.  Newborn Data: Birth date:03/06/2024 Birth time:2:55 AM Gender:Female Living status:Living Apgars: ,  Weight:   Magnesium  Sulfate received: {Mag received:30440022} BMZ received: No Rhophylac:N/A MMR:Yes T-DaP:{Tdap:23962} Flu: Yes RSV Vaccine received: {RSV:31013} Transfusion:{Transfusion received:30440034}  Immunizations received: Immunization History  Administered Date(s) Administered   Influenza, Seasonal, Injecte, Preservative Fre  09/12/2023   Tdap 03/18/2017    Physical exam  Vitals:   03/05/24 2231 03/05/24 2240 03/05/24 2300 03/06/24 0031  BP: (!) 141/101 123/71 128/79   Pulse: 87 75 73   Resp: 18 17 17    Temp:    97.8 F (36.6 C)  TempSrc:    Oral  SpO2:   99%   Weight:      Height:       General: {Exam; general:21111117} Lochia: {Desc; appropriate/inappropriate:30686::appropriate} Uterine Fundus: {Desc; firm/soft:30687} Incision: {Exam; incision:21111123} DVT Evaluation: {Exam; dvt:2111122} Labs: Lab Results  Component Value Date   WBC 4.9 03/05/2024   HGB 11.7 (L) 03/05/2024   HCT 35.3 (L) 03/05/2024   MCV 80.6 03/05/2024   PLT 211 03/05/2024      Latest Ref Rng & Units 12/11/2021    5:10 AM  CMP  Glucose 70 - 99 mg/dL 895   BUN 6 - 20 mg/dL 13   Creatinine 9.55 - 1.00 mg/dL 9.19   Sodium 864 - 854 mmol/L 136   Potassium 3.5 - 5.1 mmol/L 3.9   Chloride 98 - 111 mmol/L 105   CO2 22 - 32 mmol/L 23   Calcium 8.9 - 10.3 mg/dL 9.1   Total Protein 6.5 - 8.1 g/dL 7.7   Total Bilirubin 0.3 - 1.2 mg/dL 0.4   Alkaline Phos 38 - 126 U/L 70   AST 15 - 41 U/L 18   ALT 0 - 44 U/L 14    Edinburgh Score:    08/21/2020   12:33 AM  Edinburgh Postnatal Depression Scale Screening Tool  I have been able to laugh and see the funny side of things. 0  I have looked forward with enjoyment to  things. 0  I have blamed myself unnecessarily when things went wrong. 1  I have been anxious or worried for no good reason. 0  I have felt scared or panicky for no good reason. 1  Things have been getting on top of me. 1  I have been so unhappy that I have had difficulty sleeping. 0  I have felt sad or miserable. 0  I have been so unhappy that I have been crying. 0  The thought of harming myself has occurred to me. 0  Edinburgh Postnatal Depression Scale Total 3      Data saved with a previous flowsheet row definition   No data recorded  After visit meds:  Allergies as of 03/06/2024   No Known Allergies    Med Rec must be completed prior to using this [facility]***        Discharge home in stable condition Infant Feeding: Breast Infant Disposition:{CHL IP OB HOME WITH FNUYZM:76418} Discharge instruction: per After Visit Summary and Postpartum booklet. Activity: Advance as tolerated. Pelvic rest for 6 weeks.  Diet: routine diet Future Appointments: Future Appointments  Date Time Provider Department Center  03/09/2024  2:15 PM Nicholaus Burnard HERO, MD [facility]  04/20/2024  4:15 PM Eveline Lynwood MATSU, MD [facility]   Follow up Visit:  Follow-up Information     Center for [facility] at [facility]. Schedule an appointment as soon as possible for a visit.   Specialty: Obstetrics and Gynecology Why: Routine postpartum follow up in 4-6 weeks Contact information: [address] [place]  [address] [phone]               Message to [facility] 7/19  Please schedule this patient for a In person postpartum visit in 4 weeks with the following provider: Any provider. Additional Postpartum F/U:None  Low risk pregnancy complicated by: None Delivery mode:  Vaginal, Spontaneous Anticipated Birth Control:  IUD   03/06/2024 Mardy Shropshire, MD

## 2024-03-06 NOTE — Discharge Summary (Signed)
 Postpartum Discharge Summary  Date of Service updated***     Patient Name: Katie Mayer DOB: [birth year] MRN: [id number]  Date of admission: 03/05/2024 Delivery date:03/06/2024 Delivering provider: MILLY PLANAS A Date of discharge: 03/06/2024  Admitting diagnosis: Decreased fetal movement [O36.8190] Intrauterine pregnancy: [redacted]w[redacted]d     Secondary diagnosis:  Principal Problem:   Decreased fetal movement Active Problems:   NSVD (normal spontaneous vaginal delivery)   Hemoglobin S trait (HCC)   Group beta Strep positive  Additional problems: *** none   Discharge diagnosis: Term Pregnancy Delivered                                              Post partum procedures:*** Augmentation: Pitocin  Complications: None  Hospital course: Induction of Labor With Vaginal Delivery   33 y.o. yo [phone] at [redacted]w[redacted]d was admitted to the hospital 03/05/2024 for induction of labor.  Indication for induction: decreased fetal movement.  Patient had an labor course complicated by variable decelerations requiring amnioinfusion.  Membrane Rupture Time/Date: 9:46 PM,03/05/2024  Delivery Method:Vaginal, Spontaneous Operative Delivery:N/A Episiotomy: None Lacerations:  None Details of delivery can be found in separate delivery note.  Patient had a postpartum course complicated by***. Patient is discharged home 03/06/24.  Newborn Data: Birth date:03/06/2024 Birth time:2:55 AM Gender:Female Living status:Living Apgars: ,  Weight:   Magnesium  Sulfate received: No BMZ received: No Rhophylac:No MMR:No T-DaP:*** ordered postpartum Flu: Yes RSV Vaccine received: No Transfusion:{Transfusion received:30440034}  Immunizations received: Immunization History  Administered Date(s) Administered   Influenza, Seasonal, Injecte, Preservative Fre 09/12/2023   Tdap 03/18/2017    Physical exam  Vitals:   03/05/24 2231 03/05/24 2240 03/05/24 2300 03/06/24 0031  BP: (!) 141/101 123/71 128/79    Pulse: 87 75 73   Resp: 18 17 17    Temp:    97.8 F (36.6 C)  TempSrc:    Oral  SpO2:   99%   Weight:      Height:       General: {Exam; general:21111117} Lochia: {Desc; appropriate/inappropriate:30686::appropriate} Uterine Fundus: {Desc; firm/soft:30687} Incision: {Exam; incision:21111123} DVT Evaluation: {Exam; dvt:2111122} Labs: Lab Results  Component Value Date   WBC 4.9 03/05/2024   HGB 11.7 (L) 03/05/2024   HCT 35.3 (L) 03/05/2024   MCV 80.6 03/05/2024   PLT 211 03/05/2024      Latest Ref Rng & Units 12/11/2021    5:10 AM  CMP  Glucose 70 - 99 mg/dL 895   BUN 6 - 20 mg/dL 13   Creatinine 9.55 - 1.00 mg/dL 9.19   Sodium 864 - 854 mmol/L 136   Potassium 3.5 - 5.1 mmol/L 3.9   Chloride 98 - 111 mmol/L 105   CO2 22 - 32 mmol/L 23   Calcium 8.9 - 10.3 mg/dL 9.1   Total Protein 6.5 - 8.1 g/dL 7.7   Total Bilirubin 0.3 - 1.2 mg/dL 0.4   Alkaline Phos 38 - 126 U/L 70   AST 15 - 41 U/L 18   ALT 0 - 44 U/L 14    Edinburgh Score:    08/21/2020   12:33 AM  Edinburgh Postnatal Depression Scale Screening Tool  I have been able to laugh and see the funny side of things. 0  I have looked forward with enjoyment to things. 0  I have blamed myself unnecessarily when things went wrong. 1  I have been  anxious or worried for no good reason. 0  I have felt scared or panicky for no good reason. 1  Things have been getting on top of me. 1  I have been so unhappy that I have had difficulty sleeping. 0  I have felt sad or miserable. 0  I have been so unhappy that I have been crying. 0  The thought of harming myself has occurred to me. 0  Edinburgh Postnatal Depression Scale Total 3      Data saved with a previous flowsheet row definition   No data recorded  After visit meds:  Allergies as of 03/06/2024   No Known Allergies   Med Rec must be completed prior to using this [facility]***        Discharge home in stable condition Infant Feeding: {Baby  feeding:23562} Infant Disposition:{CHL IP OB HOME WITH FNUYZM:76418} Discharge instruction: per After Visit Summary and Postpartum booklet. Activity: Advance as tolerated. Pelvic rest for 6 weeks.  Diet: {OB ipzu:78888878} Future Appointments: Future Appointments  Date Time Provider Department Center  03/09/2024  2:15 PM Nicholaus Burnard HERO, MD [facility]  04/20/2024  4:15 PM Eveline Lynwood MATSU, MD [facility]   Follow up Visit:  Follow-up Information     Center for [facility] Healthcare at [facility]. Schedule an appointment as soon as possible for a visit.   Specialty: Obstetrics and Gynecology Why: Routine postpartum follow up in 4-6 weeks Contact information: [address] [place]  [address] [phone]                Message sent to [facility] on 03/06/24:  Please schedule this patient for a In person postpartum visit in 6 weeks with the following provider: Any provider. Additional Postpartum F/U:n/a  Low risk pregnancy complicated by: n/a Delivery mode:  Vaginal, Spontaneous Anticipated Birth Control:  IUD, Mirena  outpatient   03/06/2024 Olam Boards, CNM

## 2024-03-06 NOTE — Progress Notes (Signed)
 Katie Mayer is a 33 y.o. [phone] at [redacted]w[redacted]d admitted for induction of labor due to DFM.  Subjective: Pt comfortable with epidural.  Family in room for support.  Objective: BP 128/79   Pulse 73   Temp 97.8 F (36.6 C) (Oral)   Resp 17   Ht 5' 7 (1.702 m)   Wt 100.7 kg   LMP 05/28/2023   SpO2 99%   BMI 34.77 kg/m  No intake/output data recorded. Total I/O In: -  Out: 400 [Urine:400]  FHT:  FHR: 135 bpm, variability: moderate,  accelerations:  Abscent,  decelerations:  Present recurrent variables and early decels UC:   regular, every 3 minutes SVE:   Dilation: 7 Effacement (%): 80 Station: -2 Exam by:: Lucienne Curls RN  Labs: Lab Results  Component Value Date   WBC 4.9 03/05/2024   HGB 11.7 (L) 03/05/2024   HCT 35.3 (L) 03/05/2024   MCV 80.6 03/05/2024   PLT 211 03/05/2024    Assessment / Plan: Induction of labor due to DFM S/P SROM Variable decelerations  Labor: On exam by CNM, cervix 5-6 cm, but stretches to 7.  LOA position noted.  IUPC placed and amnioinfusion initiated for variables.  Plan for maternal positioning for optimal fetal position.  Will continue to monitor progress and FHR. Preeclampsia:  n/a Fetal Wellbeing:  Category II Pain Control:  Epidural I/D:  GBS pos, PCN Anticipated MOD:  NSVD  Olam Boards, CNM 03/06/2024, 1:09 AM

## 2024-03-06 NOTE — Anesthesia Postprocedure Evaluation (Signed)
 Anesthesia Post Note  Patient: Katie Mayer  Procedure(s) Performed: AN AD HOC LABOR EPIDURAL     Patient location during evaluation: Mother Baby Anesthesia Type: Epidural Level of consciousness: awake and alert and oriented Pain management: satisfactory to patient Vital Signs Assessment: post-procedure vital signs reviewed and stable Respiratory status: respiratory function stable Cardiovascular status: stable Postop Assessment: no headache, no backache, epidural receding, patient able to bend at knees, no signs of nausea or vomiting, adequate PO intake and able to ambulate Anesthetic complications: no   No notable events documented.  Last Vitals:  Vitals:   03/06/24 0450 03/06/24 0541  BP: 119/81 104/68  Pulse: 79 76  Resp: 17 17  Temp: 36.7 C 36.7 C  SpO2: 100%     Last Pain:  Vitals:   03/06/24 0548  TempSrc:   PainSc: 0-No pain   Pain Goal: Patients Stated Pain Goal: 0 (03/05/24 2052)                 PURNELL PORTAL

## 2024-03-06 NOTE — Plan of Care (Signed)
 Problem: Education: Goal: Knowledge of Childbirth will improve 03/06/2024 0325 by Ellena Lucienne BIRCH, RN Outcome: Completed/Met 03/05/2024 1955 by Ellena Lucienne BIRCH, RN Outcome: Progressing Goal: Ability to make informed decisions regarding treatment and plan of care will improve 03/06/2024 0325 by Ellena Lucienne BIRCH, RN Outcome: Completed/Met 03/05/2024 1955 by Ellena Lucienne BIRCH, RN Outcome: Progressing Goal: Ability to state and carry out methods to decrease the pain will improve 03/06/2024 0325 by Ellena Lucienne BIRCH, RN Outcome: Completed/Met 03/05/2024 1955 by Ellena Lucienne BIRCH, RN Outcome: Progressing Goal: Individualized Educational Video(s) 03/06/2024 0325 by Ellena Lucienne BIRCH, RN Outcome: Completed/Met 03/05/2024 1955 by Ellena Lucienne BIRCH, RN Outcome: Progressing   Problem: Coping: Goal: Ability to verbalize concerns and feelings about labor and delivery will improve 03/06/2024 0325 by Shaliah Wann D, RN Outcome: Completed/Met 03/05/2024 1955 by Ellena Lucienne BIRCH, RN Outcome: Progressing   Problem: Life Cycle: Goal: Ability to make normal progression through stages of labor will improve 03/06/2024 0325 by Ellena Lucienne BIRCH, RN Outcome: Completed/Met 03/05/2024 1955 by Ellena Lucienne BIRCH, RN Outcome: Progressing Goal: Ability to effectively push during vaginal delivery will improve 03/06/2024 0325 by Ellena Lucienne BIRCH, RN Outcome: Completed/Met 03/05/2024 1955 by Ellena Lucienne BIRCH, RN Outcome: Progressing   Problem: Role Relationship: Goal: Will demonstrate positive interactions with the child 03/06/2024 0325 by Ellena Lucienne BIRCH, RN Outcome: Completed/Met 03/05/2024 1955 by Ellena Lucienne BIRCH, RN Outcome: Progressing   Problem: Safety: Goal: Risk of complications during labor and delivery will decrease 03/06/2024 0325 by Ellena Lucienne BIRCH, RN Outcome: Completed/Met 03/05/2024 1955 by Ellena Lucienne BIRCH, RN Outcome: Progressing   Problem:  Pain Management: Goal: Relief or control of pain from uterine contractions will improve 03/06/2024 0325 by Ellena Lucienne BIRCH, RN Outcome: Completed/Met 03/05/2024 1955 by Ellena Lucienne BIRCH, RN Outcome: Progressing   Problem: Education: Goal: Knowledge of General Education information will improve Description: Including pain rating scale, medication(s)/side effects and non-pharmacologic comfort measures 03/06/2024 0325 by Ellena Lucienne BIRCH, RN Outcome: Completed/Met 03/05/2024 1955 by Ellena Lucienne BIRCH, RN Outcome: Progressing   Problem: Health Behavior/Discharge Planning: Goal: Ability to manage health-related needs will improve 03/06/2024 0325 by Ellena Lucienne BIRCH, RN Outcome: Completed/Met 03/05/2024 1955 by Ellena Lucienne BIRCH, RN Outcome: Progressing   Problem: Clinical Measurements: Goal: Ability to maintain clinical measurements within normal limits will improve 03/06/2024 0325 by Ellena Lucienne BIRCH, RN Outcome: Completed/Met 03/05/2024 1955 by Ellena Lucienne BIRCH, RN Outcome: Progressing Goal: Will remain free from infection 03/06/2024 0325 by Ellena Lucienne BIRCH, RN Outcome: Completed/Met 03/05/2024 1955 by Ellena Lucienne BIRCH, RN Outcome: Progressing Goal: Diagnostic test results will improve 03/06/2024 0325 by Ellena Lucienne BIRCH, RN Outcome: Completed/Met 03/05/2024 1955 by Ellena Lucienne BIRCH, RN Outcome: Progressing Goal: Respiratory complications will improve 03/06/2024 0325 by Ellena Lucienne BIRCH, RN Outcome: Completed/Met 03/05/2024 1955 by Ellena Lucienne BIRCH, RN Outcome: Progressing Goal: Cardiovascular complication will be avoided 03/06/2024 0325 by Arraya Buck D, RN Outcome: Completed/Met 03/05/2024 1955 by Ellena Lucienne BIRCH, RN Outcome: Progressing   Problem: Activity: Goal: Risk for activity intolerance will decrease 03/06/2024 0325 by Ellena Lucienne BIRCH, RN Outcome: Completed/Met 03/05/2024 1955 by Ellena Lucienne BIRCH, RN Outcome:  Progressing   Problem: Nutrition: Goal: Adequate nutrition will be maintained 03/06/2024 0325 by Ellena Lucienne BIRCH, RN Outcome: Completed/Met 03/05/2024 1955 by Ellena Lucienne BIRCH, RN Outcome: Progressing   Problem: Coping: Goal: Level of anxiety will decrease 03/06/2024 0325 by Ellena Lucienne BIRCH, RN Outcome: Completed/Met 03/05/2024 1955 by Ellena Lucienne BIRCH, RN Outcome: Progressing   Problem: Elimination:  Goal: Will not experience complications related to bowel motility 03/06/2024 0325 by Ellena Lucienne BIRCH, RN Outcome: Completed/Met 03/05/2024 1955 by Ellena Lucienne BIRCH, RN Outcome: Progressing Goal: Will not experience complications related to urinary retention 03/06/2024 0325 by Ellena Lucienne BIRCH, RN Outcome: Completed/Met 03/05/2024 1955 by Ellena Lucienne BIRCH, RN Outcome: Progressing   Problem: Pain Managment: Goal: General experience of comfort will improve and/or be controlled 03/06/2024 0325 by Nathen Balaban D, RN Outcome: Completed/Met 03/05/2024 1955 by Ellena Lucienne BIRCH, RN Outcome: Progressing   Problem: Safety: Goal: Ability to remain free from injury will improve 03/06/2024 0325 by Ellena Lucienne BIRCH, RN Outcome: Completed/Met 03/05/2024 1955 by Ellena Lucienne BIRCH, RN Outcome: Progressing   Problem: Skin Integrity: Goal: Risk for impaired skin integrity will decrease 03/06/2024 0325 by Ellena Lucienne BIRCH, RN Outcome: Completed/Met 03/05/2024 1955 by Ellena Lucienne BIRCH, RN Outcome: Progressing

## 2024-03-06 NOTE — Lactation Note (Signed)
 This note was copied from a baby's chart. Lactation Consultation Note  Patient Name: Katie Mayer Date: 03/06/2024 Age:33 hours Reason for consult: Initial assessment;Term.  LC entered the room, infant was cuing to breastfeed, LC gave [name] pillow support and [name] latch infant on her right breast using the cross cradle hold, infant sustained latch with depth, infant was still breastfeeding when [facility] left the room after 12 minutes. [name] knows if infant is still cuing after latch on the first breast to offer the 2nd breast during the same feeding. LC reviewed hand expression with breast model and [name] easily expressed 2 mls of colostrum that was spoon feed to infant. LC discussed infant's input and output and Per [name], infant had 6 voids and 4 stools since birth. [name] was made aware of O/P services, breastfeeding support groups, community resources, and our phone # for post-discharge questions.   Current feeding plan Day 1 1- [name] will continue to breastfeed infant by cues, on demand, 8-12 times within 24 hours, skin to skin. 2- [name] knows if infant is still cuing after feeding on the first breast to offer the 2nd breast during the same feeding. 3- [name] knows to ask for further latch assistance if needed. 4- [name] knows to have maternal rest, meals and hydration.    Maternal Data Has patient been taught Hand Expression?: Yes Does the patient have breastfeeding experience prior to this delivery?: Yes How long did the patient breastfeed?: Per [name], she breastfeed her 2nd and 3rd child for 18 months each.  Feeding Mother's Current Feeding Choice: Breast Milk  LATCH Score Latch: Grasps breast easily, tongue down, lips flanged, rhythmical sucking.  Audible Swallowing: Spontaneous and intermittent  Type of Nipple: Everted at rest and after stimulation  Comfort (Breast/Nipple): Soft / non-tender  Hold (Positioning): Assistance needed to correctly position infant at breast and maintain  latch.  LATCH Score: 9   Lactation Tools Discussed/Used    Interventions Interventions: Breast feeding basics reviewed;Assisted with latch;Skin to skin;Breast compression;Adjust position;Support pillows;Position options;Expressed milk;Education;LC Services brochure;Guidelines for Milk Supply and Pumping Schedule Handout;CDC milk storage guidelines;CDC Guidelines for Breast Pump Cleaning  Discharge Pump: DEBP;Personal  Consult Status Consult Status: Follow-up Date: 03/07/24 Follow-up type: In-patient    Katie Mayer 03/06/2024, 11:43 PM

## 2024-03-07 LAB — BIRTH TISSUE RECOVERY COLLECTION (PLACENTA DONATION)

## 2024-03-07 MED ORDER — FERROUS SULFATE 325 (65 FE) MG PO TABS: 325.0000 mg | ORAL_TABLET | ORAL | 0 refills | Status: DC

## 2024-03-07 MED ORDER — IBUPROFEN 600 MG PO TABS: 600.0000 mg | ORAL_TABLET | Freq: Four times a day (QID) | ORAL | 0 refills | Status: DC

## 2024-03-07 MED ORDER — ACETAMINOPHEN 325 MG PO TABS: 650.0000 mg | ORAL_TABLET | ORAL | 0 refills | Status: DC | PRN

## 2024-03-07 MED ORDER — SENNOSIDES-DOCUSATE SODIUM 8.6-50 MG PO TABS: 2.0000 | ORAL_TABLET | Freq: Every evening | ORAL | 0 refills | Status: DC | PRN

## 2024-03-07 NOTE — Lactation Note (Signed)
 This note was copied from a baby's chart. Lactation Consultation Note  Patient Name: Boy Jyasia Markoff Unijb'd Date: 03/07/2024 Age:33 hours  Reason for consult: Follow-up assessment  P4, [redacted]w[redacted]d, 7% weight loss  LC visit with experienced breastfeeding mother. Infant is feeding well and mother denies any questions.   Discussed management of engorgement, signs and symptom of mastitis and if occurs to report to patient's OB provider. Discussed Lactogenesis II/ supply and demand for maintaining milk supply.        Interventions Interventions: Breast feeding basics reviewed;Education  Discharge Discharge Education: Engorgement and breast care;Warning signs for feeding baby  Consult Status Consult Status: Complete Date: 03/07/24    Joshua Rojelio HERO 03/07/2024, 3:29 PM

## 2024-03-07 NOTE — Progress Notes (Signed)
 POSTPARTUM PROGRESS NOTE  Post Partum Day 1  Subjective:  Katie Mayer is a 33 y.o. [id number] s/p SVD at [redacted]w[redacted]d.  No acute events overnight.  Pt denies problems with ambulating, voiding or po intake.  She denies nausea or vomiting.  Pain is well controlled.  She has had flatus. She has not had bowel movement.  Lochia Minimal.   Objective: Blood pressure 107/75, pulse 70, temperature 98.3 F (36.8 C), temperature source Oral, resp. rate 17, height 5' 7 (1.702 m), weight 100.7 kg, last menstrual period 05/28/2023, SpO2 100%, unknown if currently breastfeeding.  Physical Exam:  General: alert, cooperative and no distress Chest: no respiratory distress Heart:regular rate, distal pulses intact Abdomen: soft, nontender,  Uterine Fundus: firm, appropriately tender DVT Evaluation: No calf swelling or tenderness Extremities: no edema Skin: warm, dry  Recent Labs    03/05/24 1330 03/06/24 0513  HGB 11.7* 12.4  HCT 35.3* 36.9    Assessment/Plan: Katie Mayer is a 33 y.o. [id number] s/p SVD at [redacted]w[redacted]d   PPD#1 - Doing well Contraception: interval Mirena  Feeding: breast Dispo: Plan for discharge 7/20 or 7/21.   LOS: 2 days   Deedee Hobert BROCKS, MD 03/07/2024, 8:00 AM

## 2024-03-09 ENCOUNTER — Encounter: Admitting: Obstetrics and Gynecology

## 2024-03-10 ENCOUNTER — Inpatient Hospital Stay (HOSPITAL_COMMUNITY): Admission: RE | Admit: 2024-03-10 | Source: Home / Self Care | Admitting: Family Medicine

## 2024-03-10 ENCOUNTER — Inpatient Hospital Stay (HOSPITAL_COMMUNITY)

## 2024-03-23 ENCOUNTER — Telehealth (HOSPITAL_COMMUNITY): Payer: Self-pay

## 2024-03-23 NOTE — Telephone Encounter (Signed)
 03/23/2024 2003  Name: Katie Mayer MRN: [id number] DOB: Aug 04, 1991  Reason for Call:  Transition of Care Hospital Discharge Call  Contact Status: Patient Contact Status: Complete  Language assistant needed:          Follow-Up Questions: Do You Have Any Concerns About Your Health As You Heal From Delivery?: Yes What Concerns Do You Have About Your Health?: Patient states that she had a nurse home visit today and that the nurse said everything is good. Patient states that she has headaches sometimes but that they go away with rest and no medication is needed. Patient states that her BP was good today at her nurse visit. RN explained preeclamsia and warning signs to patient. RN told patient to reach out to her OB with any persistent headaches or preeclamsia warning signs. Patient has no other questions or concerns. Do You Have Any Concerns About Your Infants Health?: No  Edinburgh Postnatal Depression Scale:  In the Past 7 Days: I have been able to laugh and see the funny side of things.: As much as I always could I have looked forward with enjoyment to things.: As much as I ever did I have blamed myself unnecessarily when things went wrong.: Not very often I have been anxious or worried for no good reason.: No, not at all I have felt scared or panicky for no good reason.: No, not at all Things have been getting on top of me.: No, I have been coping as well as ever I have been so unhappy that I have had difficulty sleeping.: Not at all I have felt sad or miserable.: No, not at all I have been so unhappy that I have been crying.: No, never The thought of harming myself has occurred to me.: Never [place] Postnatal Depression Scale Total: 1  PHQ2-9 Depression Scale:     Discharge Follow-up: Edinburgh score requires follow up?: No Patient was advised of the following resources:: Breastfeeding Support Group, Support Group  Post-discharge interventions: Reviewed Newborn Safe  Sleep Practices  Signature  Rosaline Deretha PEAK

## 2024-03-25 ENCOUNTER — Telehealth: Payer: Self-pay | Admitting: Family Medicine

## 2024-03-25 ENCOUNTER — Telehealth: Admitting: Physician Assistant

## 2024-03-25 DIAGNOSIS — N644 Mastodynia: Secondary | ICD-10-CM

## 2024-03-25 NOTE — Patient Instructions (Signed)
  Katie Mayer, thank you for joining Teena Shuck, PA-C for today's virtual visit.  While this provider is not your primary care provider (PCP), if your PCP is located in our provider database this encounter information will be shared with them immediately following your visit.   A [facility] MyChart account gives you access to today's visit and all your visits, tests, and labs performed at [facility]  click here if you don't have a [facility] MyChart account or go to [email or web address]  Consent: (Patient) Katie Mayer provided verbal consent for this virtual visit at the beginning of the encounter.  Current Medications:  Current Outpatient Medications:    acetaminophen  (TYLENOL ) 325 MG tablet, Take 2 tablets (650 mg total) by mouth every 4 (four) hours as needed., Disp: 60 tablet, Rfl: 0   ferrous sulfate  325 (65 FE) MG tablet, Take 1 tablet (325 mg total) by mouth every other day., Disp: 45 tablet, Rfl: 0   ibuprofen  (ADVIL ) 600 MG tablet, Take 1 tablet (600 mg total) by mouth every 6 (six) hours., Disp: 30 tablet, Rfl: 0   Prenatal Vit-Fe Fumarate-FA (M-NATAL PLUS) 27-1 MG TABS, TAKE 1 TABLET BY MOUTH DAILY AT 12 NOON., Disp: 90 tablet, Rfl: 2   senna-docusate (SENOKOT-S) 8.6-50 MG tablet, Take 2 tablets by mouth at bedtime as needed for mild constipation., Disp: 30 tablet, Rfl: 0   Medications ordered in this encounter:  No orders of the defined types were placed in this encounter.    *If you need refills on other medications prior to your next appointment, please contact your pharmacy*  Follow-Up: Call back or seek an in-person evaluation if the symptoms worsen or if the condition fails to improve as anticipated.  [facility] [phone]  Other Instructions Report to Urgent care for evaluation.   If you have been instructed to have an in-person evaluation today at a local Urgent Care facility, please use the link below. It  will take you to a list of all of our available [facility], including address, phone number and hours of operation. Please do not delay care.  [facility]  If you or a family member do not have a primary care provider, use the link below to schedule a visit and establish care. When you choose a [facility] primary care physician or advanced practice provider, you gain a long-term partner in health. Find a Primary Care Provider  Learn more about [facility]'s in-office and virtual care options: [facility] - Get Care Now

## 2024-03-25 NOTE — Telephone Encounter (Signed)
 Patient is calling because she said she has mastitis and she spoke with a nurse who told her to call and make an appointment. I let the patient know that I will send a message to our lactation specialist and that they will reach back out to her when they are back in the office.

## 2024-03-25 NOTE — Progress Notes (Signed)
 Virtual Visit Consent   Katie Mayer, you are scheduled for a virtual visit with a [facility] provider today. Just as with appointments in the office, your consent must be obtained to participate. Your consent will be active for this visit and any virtual visit you may have with one of our providers in the next 365 days. If you have a MyChart account, a copy of this consent can be sent to you electronically.  As this is a virtual visit, video technology does not allow for your provider to perform a traditional examination. This may limit your provider's ability to fully assess your condition. If your provider identifies any concerns that need to be evaluated in person or the need to arrange testing (such as labs, EKG, etc.), we will make arrangements to do so. Although advances in technology are sophisticated, we cannot ensure that it will always work on either your end or our end. If the connection with a video visit is poor, the visit may have to be switched to a telephone visit. With either a video or telephone visit, we are not always able to ensure that we have a secure connection.  By engaging in this virtual visit, you consent to the provision of healthcare and authorize for your insurance to be billed (if applicable) for the services provided during this visit. Depending on your insurance coverage, you may receive a charge related to this service.  I need to obtain your verbal consent now. Are you willing to proceed with your visit today? Katie Mayer has provided verbal consent on 03/25/2024 for a virtual visit (video or telephone). Teena Shuck, [place]  Date: 03/25/2024 3:01 PM   Virtual Visit via Video Note   I, Teena Shuck, connected with  Katie Mayer  ([id number], 08/30/1990) on 03/25/24 at  3:00 PM EDT by a video-enabled telemedicine application and verified that I am speaking with the correct person using two identifiers.  Location: Patient: Virtual Visit  Location Patient: Home Provider: Virtual Visit Location Provider: Home Office   I discussed the limitations of evaluation and management by telemedicine and the availability of in person appointments. The patient expressed understanding and agreed to proceed.    History of Present Illness: Katie Mayer is a 33 y.o. who identifies as a female who was assigned female at birth, and is being seen today for right breast pain.  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   NSVD (normal spontaneous vaginal delivery) 01/19/2016    Allergies: No Known Allergies Medications:  Current Outpatient Medications:    acetaminophen  (TYLENOL ) 325 MG tablet, Take 2 tablets (650 mg total) by mouth every 4 (four) hours as needed., Disp: 60 tablet, Rfl: 0   ferrous sulfate  325 (65 FE) MG tablet, Take 1 tablet (325 mg total) by mouth every other day., Disp: 45 tablet, Rfl: 0   ibuprofen  (ADVIL ) 600 MG tablet, Take 1 tablet (600 mg total) by mouth every 6 (six) hours., Disp: 30 tablet, Rfl: 0   Prenatal Vit-Fe Fumarate-FA (M-NATAL PLUS) 27-1 MG TABS, TAKE 1 TABLET BY MOUTH DAILY AT 12 NOON., Disp: 90 tablet, Rfl: 2   senna-docusate (SENOKOT-S) 8.6-50 MG tablet, Take 2 tablets by mouth at bedtime as needed for mild constipation.,  Disp: 30 tablet, Rfl: 0  Observations/Objective: Patient is well-developed, well-nourished in no acute distress.  Resting comfortably  at home.  Head is normocephalic, atraumatic.  No labored breathing.  Speech is clear and coherent with logical content.  Patient is alert and oriented at baseline.    Assessment and Plan: 1. Breast pain, right (Primary)  Patient presenting with right beast pain, felt to most likely  be mastitis however, I am unable to examine her via telehealth. Advised her to present in person to the nearest UC or ER for evaluation. Patient verbalized understanding.   Follow Up Instructions: I discussed the assessment and treatment plan with the patient. The patient was provided an opportunity to ask questions and all were answered. The patient agreed with the plan and demonstrated an understanding of the instructions.  A copy of instructions were sent to the patient via MyChart unless otherwise noted below.     The patient was advised to call back or seek an in-person evaluation if the symptoms worsen or if the condition fails to improve as anticipated.    Teena Shuck, PA-C

## 2024-03-29 ENCOUNTER — Telehealth: Payer: Self-pay

## 2024-03-29 NOTE — Telephone Encounter (Signed)
 Called mom to assess mastitis/breast feeding status and to offer and OP Lactation appointment.    No answer    Unable to leave voice mail

## 2024-03-30 ENCOUNTER — Telehealth: Payer: Self-pay | Admitting: Lactation Services

## 2024-03-30 NOTE — Telephone Encounter (Signed)
 Called mom to assess mastitis/breast feeding status and to offer and OP Lactation appointment.     Second attempt. Called once, no answer, unable to leave message. Follow up as needed.  Vermell Pelt IBCLC

## 2024-04-10 ENCOUNTER — Encounter: Payer: Self-pay | Admitting: Family Medicine

## 2024-04-20 ENCOUNTER — Other Ambulatory Visit: Payer: Self-pay

## 2024-04-20 ENCOUNTER — Ambulatory Visit: Admitting: Obstetrics & Gynecology

## 2024-04-20 ENCOUNTER — Encounter: Payer: Self-pay | Admitting: Obstetrics & Gynecology

## 2024-04-20 NOTE — Progress Notes (Signed)
 Post Partum Visit Note  Katie Mayer is a 33 y.o. [phone] female who presents for a postpartum visit. She is 6 weeks postpartum following a normal spontaneous vaginal delivery.  I have fully reviewed the prenatal and intrapartum course. The delivery was at 40 gestational weeks.  Anesthesia: epidural. Postpartum course has been good. Baby is doing well. Baby is feeding by breast. Bleeding no bleeding and pink. Bowel function is normal. Bladder function is normal. Patient is not sexually active. Contraception method is IUD. Postpartum depression screening: negative.   The pregnancy intention screening data noted above was reviewed. Potential methods of contraception were discussed. The patient elected to proceed with No data recorded.   Edinburgh Postnatal Depression Scale - 04/20/24 1647       Edinburgh Postnatal Depression Scale:  In the Past 7 Days   I have been able to laugh and see the funny side of things. 0    I have looked forward with enjoyment to things. 0    I have blamed myself unnecessarily when things went wrong. 1    I have been anxious or worried for no good reason. 0    I have felt scared or panicky for no good reason. 0    Things have been getting on top of me. 0    I have been so unhappy that I have had difficulty sleeping. 0    I have felt sad or miserable. 0    I have been so unhappy that I have been crying. 0    The thought of harming myself has occurred to me. 0    Edinburgh Postnatal Depression Scale Total 1          Health Maintenance Due  Topic Date Due   COVID-19 Vaccine (1) Never done   Hepatitis B Vaccines 19-59 Average Risk (1 of 3 - 19+ 3-dose series) Never done   HPV VACCINES (1 - Risk 3-dose SCDM series) Never done   INFLUENZA VACCINE  03/19/2024    The following portions of the patient's history were reviewed and updated as appropriate: allergies, current medications, past family history, past medical history, past social history, past  surgical history, and problem list.  Review of Systems Pertinent items are noted in HPI.  Objective:  BP 116/75 (BP Location: Right Arm, Patient Position: Sitting, Cuff Size: Large)   Pulse 80   LMP 05/28/2023   SpO2 100%    General:  alert, cooperative, and no distress   Breasts:  not indicated  Lungs:   Heart:  regular rate and rhythm  Abdomen:    Wound   GU exam:  not indicated       Assessment:    There are no diagnoses linked to this encounter.  Normal  postpartum exam.   Plan:   Essential components of care per ACOG recommendations:  1.  Mood and well being: Patient with negative depression screening today. Reviewed local resources for support.  - Patient tobacco use? No.   - hx of drug use? No.    2. Infant care and feeding:  -Patient currently breastmilk feeding? Yes. Discussed returning to work and pumping.  -Social determinants of health (SDOH) reviewed in EPIC. No concerns  3. Sexuality, contraception and birth spacing - Patient does not want a pregnancy in the next year.  Desired family size is 4 children.  - Reviewed reproductive life planning. Reviewed contraceptive methods based on pt preferences and effectiveness.  Patient desired IUD or IUS today.   -  Discussed birth spacing of 18 months  4. Sleep and fatigue -Encouraged family/partner/community support of 4 hrs of uninterrupted sleep to help with mood and fatigue  5. Physical Recovery  - Discussed patients delivery and complications. She describes her labor as good. - Patient had a Vaginal, no problems at delivery. - Patient has urinary incontinence? No. - Patient is safe to resume physical and sexual activity  6.  Health Maintenance - HM due items addressed Yes - Last pap smear  Diagnosis  Date Value Ref Range Status  11/14/2022   Final   - Negative for intraepithelial lesion or malignancy (NILM)   Pap smear not done at today's visit.  -Breast Cancer screening indicated? No.   7. Chronic  Disease/Pregnancy Condition follow up: None RTC for IUD, abstain until then   Lynwood Solomons, MD [facility], [facility]

## 2024-05-24 NOTE — Progress Notes (Signed)
 [facility]  Urgent Care  [place] PA-C  Patient ID:  Katie Mayer is a 33 y.o.  [birth year]   female    Chest Pain (Patient states she started experiencing chest pain at 4pm today. )    The following information was reviewed by members of the visit team:  Tobacco  Allergies  Meds  Problems  Med Hx  Surg Hx  Fam Hx     Medical History[1] Problem List[2]  History of Present Illness This is a 33 year old female with a history of GERD, sickle cell trait, hypercholesterolemia, and vitamin D deficiency presenting for chest pain. She is not allergic to any medications and is not currently taking any medication. She does not dip, chew, smoke, or vape.  The patient reports experiencing fluctuating chest pain that began around 4:00 or 5:00 PM. 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 The pain was described as sharp and shooting, lasting only a few seconds before subsiding. She experienced this pain 2 or 3 times since arriving at the clinic but did not have any episodes last night. She reports no recent indigestion or heartburn and maintains a healthy diet, avoiding fast food and spicy meals. Her last meal was at 1:00 PM, consisting of a pack of noodles with boiled eggs, and she did not use the spice provided with the noodles. She also reports no increased burping or gas. She is currently breastfeeding and has previously taken Prilosec during pregnancy.   SOCIAL HISTORY She does not dip, chew, smoke, or vape.    Review of Systems: All others reviewed and negative except as listed above. Vitals:   05/24/24 2021  BP: 106/71  BP Location: Left arm  Patient Position: Sitting  Pulse: 75  Resp: 18  Temp: 97.9 F (36.6 C)  TempSrc: Oral  SpO2: 100%  Weight: 92.1 kg (203 lb)  Height: 1.676 m (5' 6)   Physical Exam General Appearance: WN, WD  NAD nontoxic or nonlethargic in appearance. Vital signs: Within normal limits. Head: Normocephalic, Atraumatic. Eyes: Conjunctiva clear. Ears: No obvious otorrhea. Nose: No obvious rhinorrhea. Mouth/Throat: Oral pharynx is moist with some mild cobblestoning. Lymphatic: No Cervical, Supraclavicular Lymphadenopathy noted. Neck: No cervical lymphadenopathy. Respiratory: Lungs are clear to auscultation bilaterally. Cardiovascular: Heart has a regular rate and rhythm. Beats per minute while listening is 75. Gastrointestinal: Positive bowel sounds. No bruits heard, no pulsatile masses. Genitourinary:  Back, Musculoskeletal: No CVA tenderness. Extremities: Good ROM, No gross edema, good cap refill. Skin: Warm and dry, no rash. Neurological: Alert and oriented x3, Speech clear/fluent good understanding Face symmetrical. Psychiatric: Affect is normal.        EKG: Normal sinus rhythm Vent Rate:  73 NonspecificT waves    NO ST elevation or Depression No Q waves PR Interval in ms:  152  QRS Duration in ms:  68  QT/Qtc-Baz in ms:  392/431  P-R-T axes:            67 45 43   Normal ekg         Assessment & Plan Initial Assessment: Chest pain likely due to reflux or indigestion-related gas bubbles. EKG results within normal limits.  Differential Diagnosis: - Cardiac issue: EKG normal, pain lasting seconds, sharp shooting pain. Unlikely cardiac. - Reflux/indigestion: History of reflux, recent stressors, dietary changes. Monitor diet, avoid trigger foods, start omeprazole if safe for nursing.  Urgent  Care Course: - EKG obtained, normal results - Examined, lungs clear, heart regular rate and rhythm - Positive bowel sounds, no bruits, no pulsatile masses  Final Assessment: EKG normal, chest pain likely due to reflux or indigestion-related gas bubbles. Advised dietary monitoring, slow exercise initiation, omeprazole prescription pending safety confirmation.  Clinical Impression: -  Reflux/indigestion-related chest pain   Disposition: - Follow-Up: Omeprazole prescription pending safety confirmation for nursing  Patient Education: Advised to monitor diet, avoid foods that may trigger reflux, start exercising slowly, ensure good stretching before exercise, walk instead of run.   Red Flags associated with diagnosis/es were reviewed, action plan instructions given. Strict return Precautions given. Patient (and Family if applicable) agreed with plan and voiced understanding.  No barriers to adherence perceived by myself. Portions of this note may have been dictated using Psychologist, clinical.  Disposition: Follow up with PCP   Electronically signed: Scarlette Vicci Needle, PA-C  05/24/2024 9:18 PM         [1] Past Medical History: Diagnosis Date  . GERD (gastroesophageal reflux disease)   [2] Patient Active Problem List Diagnosis  . Gastroesophageal reflux disease  . Sickle cell trait  . Hypercholesterolemia  . Low vitamin D level

## 2024-05-28 ENCOUNTER — Other Ambulatory Visit: Payer: Self-pay

## 2024-05-28 ENCOUNTER — Ambulatory Visit (INDEPENDENT_AMBULATORY_CARE_PROVIDER_SITE_OTHER): Admitting: Obstetrics and Gynecology

## 2024-05-28 VITALS — BP 118/79 | HR 83 | Wt 206.0 lb

## 2024-05-28 DIAGNOSIS — Z30430 Encounter for insertion of intrauterine contraceptive device: Secondary | ICD-10-CM | POA: Diagnosis not present

## 2024-05-28 DIAGNOSIS — Z3202 Encounter for pregnancy test, result negative: Secondary | ICD-10-CM | POA: Diagnosis not present

## 2024-05-28 DIAGNOSIS — Z975 Presence of (intrauterine) contraceptive device: Secondary | ICD-10-CM

## 2024-05-28 LAB — POCT PREGNANCY, URINE: Preg Test, Ur: NEGATIVE

## 2024-05-28 MED ORDER — IBUPROFEN 800 MG PO TABS
800.0000 mg | ORAL_TABLET | Freq: Once | ORAL | Status: AC
  Administered 2024-05-28: 800 mg via ORAL

## 2024-05-28 MED ORDER — LEVONORGESTREL 20.1 MCG/DAY IU IUD
1.0000 | INTRAUTERINE_SYSTEM | Freq: Once | INTRAUTERINE | Status: AC
  Administered 2024-05-28: 1 via INTRAUTERINE

## 2024-05-28 NOTE — Patient Instructions (Signed)
 Intrauterine Device (IUD) Insertion: What to Expect  An intrauterine device (IUD) is put in (inserted) your uterus to prevent pregnancy. It's a small, T-shaped device that has one or two nylon strings hanging down from it. The strings hang out of your cervix, which is the lowest part of your uterus. Tell a health care provider about: Any allergies you have. All medicines you take. These include vitamins, herbs, eye drops, and creams. Any surgeries you have had. Any medical problems you have. Whether you're pregnant or may be pregnant. What are the risks? Your health care provider will talk with you about risks. These may include: Infection. Bleeding. Allergic reactions to medicines. A cut to the uterus, also called perforation, or damage to other structures or organs. Accidental placement of the IUD either in the muscle layer of the uterus or outside the uterus. The IUD falling out of the uterus. This is more common if you recently had a baby. Higher risk of ectopic pregnancy. This is when an egg is fertilized outside your uterus. This is rare. Pelvic inflammatory disease (PID). This is an infection in the uterus and fallopian tubes. The IUD doesn't cause the infection. The infection is usually from a sexually transmitted infection (STI). If this happens, it is usually during the first 20 days after the IUD is put in. This is rare. What happens before? Ask about changing or stopping: Any medicines you take. Any vitamins, herbs, or supplements you take. Your provider may tell you to take pain medicines you can buy at the store before the procedure. You may have tests for: Pregnancy. You may have a pee (urine) or blood sample taken. STIs. Placing an IUD can make an infection worse. To check for cervical cancer. You may have a Pap test, which is when cells from your cervix are removed for testing. What happens during an IUD insertion? A tool, called a speculum, will be placed in your  vagina and widened so that your provider can see your cervix. A medicine to clean your cervix may be used to help lower your risk of infection. You may be given medicine to numb your cervix. This medicine is usually given by an injection into your cervix. A tool will be put into your uterus to check the length of your uterus. A thin tube that holds the IUD will be put into your vagina, through the opening of your cervix, and into your uterus. The IUD will be placed in your uterus. The tube that holds the IUD will be removed. The strings that are attached to the IUD will be trimmed so that they sit just outside your cervix. The speculum will be removed. These steps may vary. Ask what you can expect. What happens after? You may have: Bleeding. It can vary from light bleeding or spotting for a few days to period-like bleeding. This is normal. Cramps and pain in your belly. Dizziness or light-headedness. Pain in your lower back. Headaches and the feeling like you may throw up Follow these instructions at home: Do not have sex or put anything into your vagina for 24 hours after the IUD is placed. Before having sex, check to make sure that you can feel the IUD string or strings. You should be able to feel the end of the string below the opening of your cervix. If your IUD string is in place, you may continue with sex. If you had a hormonal IUD put in more than 7 days after your most recent period  started, you will need to use a backup method of birth control for 7 days after the IUD was placed. Hormones are chemicals that affect how the body works. Check that the IUD is still in place by feeling for the strings after every period, or check once a month. Use a condom every time you have sex to prevent STIs. An IUD won't protect you from STIs. Take your medicines only as told. Contact a health care provider if: You have any of the following problems with your IUD string or strings: The string  bothers or hurts you or your sexual partner. You can't feel the string. The string has gotten longer. The IUD comes out or you can feel the IUD in your vagina. You think you may be pregnant, or you miss your period. You think you may have an STI. You have bad-smelling discharge from your vagina. You have a fever and chills. You have pain during sex. Get help right away if: You have heavy bleeding, which means soaking more than 2 pads per hour for 2 hours in a row. You have sudden, really bad belly pain. This information is not intended to replace advice given to you by your health care provider. Make sure you discuss any questions you have with your health care provider. Document Revised: 04/14/2023 Document Reviewed: 04/14/2023 Elsevier Patient Education  2024 [employer].

## 2024-05-28 NOTE — Progress Notes (Signed)
 GYNECOLOGY VISIT  Patient name: Katie Mayer MRN [id number]  Date of birth: [birth year] Chief Complaint:   Contraception  History:  Would like to have IUD insertion. Prior IUD inserted post-placental. Difficult removal previously - in office hysteroscopy for removal. Had minimal bleeding with IUD and liked that bleeding profile.    The following portions of the patient's history were reviewed and updated as appropriate: allergies, current medications, past family history, past medical history, past social history, past surgical history and problem list.   Health Maintenance:   Last pap     Component Value Date/Time   DIAGPAP  11/14/2022 1213    - Negative for intraepithelial lesion or malignancy (NILM)   HPVHIGH Negative 11/14/2022 1213   ADEQPAP  11/14/2022 1213    Satisfactory for evaluation; transformation zone component PRESENT.    Health Maintenance  Topic Date Due   COVID-19 Vaccine (1) Never done   Hepatitis B Vaccine (1 of 3 - 19+ 3-dose series) Never done   HPV Vaccine (1 - Risk 3-dose SCDM series) Never done   Flu Shot  03/19/2024   Pap with HPV screening  11/14/2027   DTaP/Tdap/Td vaccine (3 - Td or Tdap) 03/07/2034   Hepatitis C Screening  Completed   HIV Screening  Completed   Pneumococcal Vaccine  Aged Out   Meningitis B Vaccine  Aged Out      Review of Systems:  Pertinent items are noted in HPI. Comprehensive review of systems was otherwise negative.   Objective:  Physical Exam BP 118/79   Pulse 83   Wt 206 lb (93.4 kg)   LMP 05/07/2024 (Exact Date)   Breastfeeding Yes   BMI 32.26 kg/m    Physical Exam Vitals and nursing note reviewed. Exam conducted with a chaperone present.  Constitutional:      Appearance: Normal appearance.  HENT:     Head: Normocephalic and atraumatic.  Pulmonary:     Effort: Pulmonary effort is normal.     Breath sounds: Normal breath sounds.  Genitourinary:    General: Normal vulva.     Exam position:  Lithotomy position.     Vagina: Normal.     Cervix: Normal.  Skin:    General: Skin is warm and dry.  Neurological:     General: No focal deficit present.     Mental Status: She is alert.  Psychiatric:        Mood and Affect: Mood normal.        Behavior: Behavior normal.        Thought Content: Thought content normal.        Judgment: Judgment normal.      IUD Insertion Procedure Note Patient identified, informed consent performed, consent signed.   Discussed risks of irregular bleeding, cramping, infection, malpositioning or misplacement of the IUD outside the uterus which may require further procedure such as laparoscopy. Also discussed >99% contraception efficacy, increased risk of ectopic pregnancy with failure of method.  Time out was performed.  Urine pregnancy test negative.  Speculum placed in the vagina.  Cervix visualized.  Cleaned with Betadine x 2.  Anterior cervix grasped with a single tooth tenaculum.  Paracervical block was not administered.  Liletta  IUD placed per manufacturer's recommendations with device advanced to ~9.5cm.  Strings trimmed to 3 cm. Tenaculum was removed, good hemostasis noted.  Patient tolerated procedure well.   Patient was given post-procedure instructions.  She was advised to have backup contraception for one week.  Patient was  also asked to check IUD strings periodically and follow up prn for IUD check.      Assessment & Plan:  1. Encounter for IUD insertion (Primary) 2. IUD (intrauterine device) in place Now s/p uncomplicated IUD insertion. Given post-procedure instructions. Back up x1 week following insertion of device. Good for up to 8 years for contraception. Periodic string check.     Carter Quarry, MD Minimally In[facility]nec[facility]

## 2024-06-13 ENCOUNTER — Encounter: Payer: Self-pay | Admitting: Obstetrics and Gynecology

## 2024-06-13 ENCOUNTER — Encounter: Payer: Self-pay | Admitting: Nurse Practitioner

## 2024-07-07 ENCOUNTER — Encounter: Payer: Self-pay | Admitting: *Deleted

## 2024-07-07 LAB — FETAL NONSTRESS TEST

## 2024-07-29 ENCOUNTER — Ambulatory Visit: Admitting: Obstetrics & Gynecology
